# Patient Record
Sex: Female | Born: 1990 | Race: White | Hispanic: No | Marital: Married | Smoking: Never smoker
Health system: Southern US, Community
[De-identification: ages and names within clinical notes are randomized; demographics above are authoritative.]

## PROBLEM LIST (undated history)

## (undated) DIAGNOSIS — Z3046 Encounter for surveillance of implantable subdermal contraceptive: Principal | ICD-10-CM

## (undated) DIAGNOSIS — R058 Other specified cough: Secondary | ICD-10-CM

## (undated) DIAGNOSIS — F419 Anxiety disorder, unspecified: Secondary | ICD-10-CM

## (undated) DIAGNOSIS — M419 Scoliosis, unspecified: Secondary | ICD-10-CM

## (undated) DIAGNOSIS — K219 Gastro-esophageal reflux disease without esophagitis: Secondary | ICD-10-CM

## (undated) DIAGNOSIS — R05 Cough: Secondary | ICD-10-CM

## (undated) HISTORY — DX: Scoliosis, unspecified: M41.9

## (undated) HISTORY — DX: Encounter for surveillance of implantable subdermal contraceptive: Z30.46

## (undated) HISTORY — DX: Other specified cough: R05.8

## (undated) HISTORY — DX: Cough: R05

---
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
## 2010-09-17 ENCOUNTER — Other Ambulatory Visit (HOSPITAL_COMMUNITY)
Admission: RE | Admit: 2010-09-17 | Discharge: 2010-09-17 | Disposition: A | Discharge status: Home / Self Care | Payer: No Typology Code available for payment source | Source: Ambulatory Visit | Attending: Obstetrics and Gynecology | Admitting: Obstetrics and Gynecology

## 2010-09-17 DIAGNOSIS — Z113 Encounter for screening for infections with a predominantly sexual mode of transmission: Secondary | ICD-10-CM | POA: Insufficient documentation

## 2010-09-17 DIAGNOSIS — Z01419 Encounter for gynecological examination (general) (routine) without abnormal findings: Secondary | ICD-10-CM | POA: Insufficient documentation

## 2011-03-08 ENCOUNTER — Emergency Department (INDEPENDENT_AMBULATORY_CARE_PROVIDER_SITE_OTHER)
Admission: EM | Admit: 2011-03-08 | Discharge: 2011-03-08 | Disposition: A | Discharge status: Home / Self Care | Payer: No Typology Code available for payment source | Source: Home / Self Care | Attending: Family Medicine | Admitting: Family Medicine

## 2011-03-08 ENCOUNTER — Other Ambulatory Visit: Payer: Self-pay | Admitting: Family Medicine

## 2011-03-08 ENCOUNTER — Ambulatory Visit (HOSPITAL_COMMUNITY)
Admission: RE | Admit: 2011-03-08 | Discharge: 2011-03-08 | Disposition: A | Discharge status: Home / Self Care | Payer: No Typology Code available for payment source | Source: Ambulatory Visit | Attending: Family Medicine | Admitting: Family Medicine

## 2011-03-08 DIAGNOSIS — S62009A Unspecified fracture of navicular [scaphoid] bone of unspecified wrist, initial encounter for closed fracture: Secondary | ICD-10-CM

## 2011-03-08 DIAGNOSIS — R937 Abnormal findings on diagnostic imaging of other parts of musculoskeletal system: Secondary | ICD-10-CM | POA: Insufficient documentation

## 2011-03-08 DIAGNOSIS — M25539 Pain in unspecified wrist: Secondary | ICD-10-CM

## 2011-03-08 DIAGNOSIS — M25439 Effusion, unspecified wrist: Secondary | ICD-10-CM | POA: Insufficient documentation

## 2011-03-08 MED ORDER — IBUPROFEN 600 MG PO TABS: 600.0000 mg | ORAL_TABLET | Freq: Three times a day (TID) | ORAL | Status: AC

## 2011-03-08 MED ORDER — HYDROCODONE-ACETAMINOPHEN 10-325 MG PO TABS: 1.0000 | ORAL_TABLET | Freq: Once | ORAL | Status: DC

## 2011-03-08 MED ORDER — HYDROCODONE-ACETAMINOPHEN 5-325 MG PO TABS
ORAL_TABLET | ORAL | Status: AC
  Filled 2011-03-08: qty 1

## 2011-03-08 MED ORDER — HYDROCODONE-ACETAMINOPHEN 5-325 MG PO TABS
1.0000 | ORAL_TABLET | Freq: Once | ORAL | Status: AC
  Administered 2011-03-08: 1 via ORAL

## 2011-03-08 NOTE — ED Provider Notes (Signed)
History     CSN: [id number] Arrival date & time: 03/08/2011  7:47 PM   First MD Initiated Contact with Patient 03/08/11 1852      Chief Complaint  Patient presents with  . Wrist Injury    (Consider location/radiation/quality/duration/timing/severity/associated sxs/prior treatment) HPI Comments: 20 y/o right handed female no significant PMH. Was horse riding 2 days ago felt backwards to the floor landing on her right hand. Has had wrist swelling and pain since. Has taken ibuprofen and used a wrist velcro splint but no improvement, was seen earlier today at [facility] today and had X-rays showing hair line right scaphoid fracture and was asked to come here for a cast.  Denies trauma to the back, head or any other body areas.   History reviewed. No pertinent past medical history.  History reviewed. No pertinent past surgical history.  No family history on file.  History  Substance Use Topics  . Smoking status: Never Smoker   . Smokeless tobacco: Not on file  . Alcohol Use: No    OB History    Grav Para Term Preterm Abortions TAB SAB Ect Mult Living                  Review of Systems  Respiratory: Negative for cough and shortness of breath.   Musculoskeletal: Positive for joint swelling.  Neurological: Negative for weakness, numbness and headaches.    Allergies  Review of patient's allergies indicates no known allergies.  Home Medications   Current Outpatient Rx  Name Route Sig Dispense Refill  . IBUPROFEN 600 MG PO TABS Oral Take 1 tablet (600 mg total) by mouth 3 (three) times daily. 30 tablet 0    Take with food    BP 121/52  Pulse 59  Temp(Src) 98.1 F (36.7 C) (Oral)  Resp 16  SpO2 100%  Physical Exam  Nursing note and vitals reviewed. Constitutional: She is oriented to person, place, and time. She appears well-developed and well-nourished. No distress.  HENT:  Head: Normocephalic and atraumatic.  Right Ear: External ear normal.  Left Ear:  External ear normal.  Eyes: EOM are normal. Pupils are equal, round, and reactive to light.  Neck: Normal range of motion. Neck supple.  Cardiovascular: Normal heart sounds.   Pulmonary/Chest: Breath sounds normal.  Abdominal: Soft. There is no tenderness.  Musculoskeletal:       Right wrist: swelling and decreased range of motion. Focal tenderness at base of thumb with resolving bruise. Increased pain with thumb movement. Right hand neurovascularly intact. No skin brakes or lacerations.  Neurological: She is alert and oriented to person, place, and time.    ED Course  Procedures (including critical care time)  Labs Reviewed - No data to display Dg Wrist Complete Right  03/08/2011  *RADIOLOGY REPORT*  Clinical Data: History of injury.  Pain and swelling in the wrist.  RIGHT WRIST - COMPLETE 3+ VIEW  Comparison: None.  Findings: There is a hairline radiolucency through the proximal one third of the scaphoid.  This is consistent with a hairline fracture.  No comminution, dislocation, or other fracture is seen. There is slight soft tissue swelling.  IMPRESSION: Hairline radiolucency through the scaphoid is seen.  This is consistent with a hairline fracture.  Recommend conservative management for fracture.  Slight soft tissue swelling.  Original Report Authenticated By: Crawford Givens, M.D.     1. Scaphoid fracture, wrist, closed       MDM  Dr. Melvyn Novas consulted over the  phone. Pt. Was placed in thumb spica by [employer]. Symptomatic treatment will follow up with hand specialist during the week.        Sharin Grave, MD 03/10/11 1028

## 2011-03-08 NOTE — ED Notes (Signed)
Pt states she fell from her horse on Wednesday, had xray at [facility] today.  They came here for splinting/casting.  Has a hairline fracture rt wrist.  Presents with velcro splint intact.  C/o pain in rt wrist

## 2012-10-19 ENCOUNTER — Encounter: Payer: Self-pay | Admitting: Nurse Practitioner

## 2012-10-19 ENCOUNTER — Ambulatory Visit (INDEPENDENT_AMBULATORY_CARE_PROVIDER_SITE_OTHER): Payer: Federal, State, Local not specified - PPO | Admitting: Nurse Practitioner

## 2012-10-19 VITALS — BP 112/78 | HR 80 | Wt 205.0 lb

## 2012-10-19 DIAGNOSIS — F418 Other specified anxiety disorders: Secondary | ICD-10-CM

## 2012-10-19 DIAGNOSIS — F439 Reaction to severe stress, unspecified: Secondary | ICD-10-CM

## 2012-10-19 DIAGNOSIS — K219 Gastro-esophageal reflux disease without esophagitis: Secondary | ICD-10-CM

## 2012-10-19 DIAGNOSIS — F341 Dysthymic disorder: Secondary | ICD-10-CM

## 2012-10-19 DIAGNOSIS — Z733 Stress, not elsewhere classified: Secondary | ICD-10-CM

## 2012-10-19 MED ORDER — RANITIDINE HCL 300 MG PO TABS: 300.0000 mg | ORAL_TABLET | Freq: Every day | ORAL | Status: DC

## 2012-10-19 MED ORDER — CITALOPRAM HYDROBROMIDE 20 MG PO TABS: ORAL_TABLET | ORAL | Status: DC

## 2012-10-19 NOTE — Patient Instructions (Signed)
Gastroesophageal Reflux Disease, Adult  Gastroesophageal reflux disease (GERD) happens when acid from your stomach flows up into the esophagus. When acid comes in contact with the esophagus, the acid causes soreness (inflammation) in the esophagus. Over time, GERD may create small holes (ulcers) in the lining of the esophagus.  CAUSES   · Increased body weight. This puts pressure on the stomach, making acid rise from the stomach into the esophagus.  · Smoking. This increases acid production in the stomach.  · Drinking alcohol. This causes decreased pressure in the lower esophageal sphincter (valve or ring of muscle between the esophagus and stomach), allowing acid from the stomach into the esophagus.  · Late evening meals and a full stomach. This increases pressure and acid production in the stomach.  · A malformed lower esophageal sphincter.  Sometimes, no cause is found.  SYMPTOMS   · Burning pain in the lower part of the mid-chest behind the breastbone and in the mid-stomach area. This may occur twice a week or more often.  · Trouble swallowing.  · Sore throat.  · Dry cough.  · Asthma-like symptoms including chest tightness, shortness of breath, or wheezing.  DIAGNOSIS   Your caregiver may be able to diagnose GERD based on your symptoms. In some cases, X-rays and other tests may be done to check for complications or to check the condition of your stomach and esophagus.  TREATMENT   Your caregiver may recommend over-the-counter or prescription medicines to help decrease acid production. Ask your caregiver before starting or adding any new medicines.   HOME CARE INSTRUCTIONS   · Change the factors that you can control. Ask your caregiver for guidance concerning weight loss, quitting smoking, and alcohol consumption.  · Avoid foods and drinks that make your symptoms worse, such as:  · Caffeine or alcoholic drinks.  · Chocolate.  · Peppermint or mint flavorings.  · Garlic and onions.  · Spicy foods.  · Citrus fruits,  such as oranges, lemons, or limes.  · Tomato-based foods such as sauce, chili, salsa, and pizza.  · Fried and fatty foods.  · Avoid lying down for the 3 hours prior to your bedtime or prior to taking a nap.  · Eat small, frequent meals instead of large meals.  · Wear loose-fitting clothing. Do not wear anything tight around your waist that causes pressure on your stomach.  · Raise the head of your bed 6 to 8 inches with wood blocks to help you sleep. Extra pillows will not help.  · Only take over-the-counter or prescription medicines for pain, discomfort, or fever as directed by your caregiver.  · Do not take aspirin, ibuprofen, or other nonsteroidal anti-inflammatory drugs (NSAIDs).  SEEK IMMEDIATE MEDICAL CARE IF:   · You have pain in your arms, neck, jaw, teeth, or back.  · Your pain increases or changes in intensity or duration.  · You develop nausea, vomiting, or sweating (diaphoresis).  · You develop shortness of breath, or you faint.  · Your vomit is green, yellow, black, or looks like coffee grounds or blood.  · Your stool is red, bloody, or black.  These symptoms could be signs of other problems, such as heart disease, gastric bleeding, or esophageal bleeding.  MAKE SURE YOU:   · Understand these instructions.  · Will watch your condition.  · Will get help right away if you are not doing well or get worse.  Document Released: 12/19/2004 Document Revised: 06/03/2011 Document Reviewed: 09/28/2010  [employer]® Patient   Information ©2014 [employer].

## 2012-10-20 ENCOUNTER — Encounter: Payer: Self-pay | Admitting: Nurse Practitioner

## 2012-10-20 DIAGNOSIS — F418 Other specified anxiety disorders: Secondary | ICD-10-CM | POA: Insufficient documentation

## 2012-10-20 DIAGNOSIS — K219 Gastro-esophageal reflux disease without esophagitis: Secondary | ICD-10-CM | POA: Insufficient documentation

## 2012-10-20 NOTE — Assessment & Plan Note (Signed)
  Reviewed potential adverse effects of Celexa, DC med and call if any problems. Given information for [facility] and [employer], strongly encouraged mental health counseling. Recheck in 3 weeks.

## 2012-10-20 NOTE — Assessment & Plan Note (Signed)
Cut back on caffeine use. Given written information on GERD. Zantac 300 mg daily when necessary.

## 2012-10-20 NOTE — Progress Notes (Signed)
Subjective:  Presents complaints of decreased energy, and emotional lability, weight gain and sleep disturbance over the past 2 months. Also having headaches and acid reflux. Admits to being under a lot of stress. Having significant issues in her relationship with her mother. Drinks a lot of caffeine. Rare social alcohol. No drug or tobacco use. Denies any suicidal thoughts or ideation.  Objective:   BP 112/78  Pulse 80  Wt 205 lb (92.987 kg) NAD. Alert, oriented. Crying during most of office visit. Lungs clear. Heart regular rate rhythm. Abdomen soft nondistended with minimal epigastric area tenderness.  Assessment:Depression with anxiety  Situational stress  GERD (gastroesophageal reflux disease)  Plan: Meds ordered this encounter  Medications  . citalopram (CELEXA) 20 MG tablet    Sig: 1/2 tab po q hs x 6 days; then if tolerated increase to one tab    Dispense:  30 tablet    Refill:  2    Order Specific Question:  Supervising Provider    Answer:  Merlyn Albert [[id number]]  . ranitidine (ZANTAC) 300 MG tablet    Sig: Take 1 tablet (300 mg total) by mouth at bedtime.    Dispense:  30 tablet    Refill:  2    Order Specific Question:  Supervising Provider    Answer:  Riccardo Dubin   Reviewed potential adverse effects of Celexa, DC med and call if any problems. Given information for [facility] and [employer], strongly encouraged mental health counseling. Given written and verbal information on reflux disease. Recheck in 3 weeks, call back sooner if needed.

## 2012-11-12 ENCOUNTER — Ambulatory Visit: Payer: Federal, State, Local not specified - PPO | Admitting: Nurse Practitioner

## 2012-11-19 ENCOUNTER — Encounter: Payer: Self-pay | Admitting: Nurse Practitioner

## 2012-11-19 ENCOUNTER — Ambulatory Visit (INDEPENDENT_AMBULATORY_CARE_PROVIDER_SITE_OTHER): Payer: Federal, State, Local not specified - PPO | Admitting: Nurse Practitioner

## 2012-11-19 VITALS — BP 132/78 | Ht 67.0 in | Wt 209.0 lb

## 2012-11-19 DIAGNOSIS — J069 Acute upper respiratory infection, unspecified: Secondary | ICD-10-CM

## 2012-11-19 DIAGNOSIS — F341 Dysthymic disorder: Secondary | ICD-10-CM

## 2012-11-19 DIAGNOSIS — K219 Gastro-esophageal reflux disease without esophagitis: Secondary | ICD-10-CM

## 2012-11-19 DIAGNOSIS — F418 Other specified anxiety disorders: Secondary | ICD-10-CM

## 2012-11-19 MED ORDER — SUCRALFATE 1 G PO TABS: 1.0000 g | ORAL_TABLET | Freq: Four times a day (QID) | ORAL | Status: DC

## 2012-11-19 MED ORDER — CITALOPRAM HYDROBROMIDE 20 MG PO TABS: ORAL_TABLET | ORAL | Status: DC

## 2012-11-21 ENCOUNTER — Encounter: Payer: Self-pay | Admitting: Nurse Practitioner

## 2012-11-21 NOTE — Assessment & Plan Note (Signed)
Continue Zantac 300 mg daily. Add Carafate 1 g a.c. and at bedtime when necessary. Call back if persists.

## 2012-11-21 NOTE — Progress Notes (Signed)
Subjective:  Presents complaints of head congestion and scratchy throat for the past several days. No fever. Yellow nasal drainage. No cough or wheezing. No ear pain. No headache. No vomiting diarrhea or abdominal pain. Did very well on her first 2 weeks of Celexa 20 mg, states she felt the best she has in a long time. Effects have not been as good over the past 2 weeks. Denies any adverse affects. Reflux much improved on Zantac 300 mg daily, has had some breakthrough symptoms over the past 4-5 days. Depends on what she eats. Has decreased caffeine in her diet. Also decreased tomato products.  Objective:   BP 132/78  Ht 5\' 7"  (1.702 m)  Wt 209 lb (94.802 kg)  BMI 32.73 kg/m2 NAD. Alert, oriented. TMs clear effusion, no erythema. Pharynx injected with PND noted. Neck supple with mild soft nontender adenopathy. Lungs clear. Heart regular rate rhythm. Abdomen soft nondistended with active bowel sounds; nontender to palpation.  Assessment:Depression with anxiety  GERD (gastroesophageal reflux disease)  Acute upper respiratory infections of unspecified site  Plan: Meds ordered this encounter  Medications  . sucralfate (CARAFATE) 1 G tablet    Sig: Take 1 tablet (1 g total) by mouth 4 (four) times daily.    Dispense:  40 tablet    Refill:  0    Order Specific Question:  Supervising Provider    Answer:  Merlyn Albert [[id number]]  . citalopram (CELEXA) 20 MG tablet    Sig: 1 1/2 tabs po qd    Dispense:  45 tablet    Refill:  2    Order Specific Question:  Supervising Provider    Answer:  Merlyn Albert [[id number]]   increase Celexa 20 mg to one and a half tabs by mouth daily for the next 2 weeks. If needed, increase to 2 tabs per day and call back. Carafate 1 g a.c. and at bedtime when necessary reflux. Continue to cut back on caffeine intake. Continue weight loss efforts. Recheck in 3 months, call back sooner if any problems.

## 2012-11-21 NOTE — Assessment & Plan Note (Signed)
increase Celexa 20 mg to one and a half tabs by mouth daily for the next 2 weeks. If needed, increase to 2 tabs per day and call back

## 2013-01-24 ENCOUNTER — Emergency Department (HOSPITAL_COMMUNITY)
Admission: EM | Admit: 2013-01-24 | Discharge: 2013-01-25 | Disposition: A | Discharge status: Home / Self Care | Payer: Federal, State, Local not specified - PPO | Attending: Emergency Medicine | Admitting: Emergency Medicine

## 2013-01-24 ENCOUNTER — Encounter (HOSPITAL_COMMUNITY): Payer: Self-pay | Admitting: Emergency Medicine

## 2013-01-24 ENCOUNTER — Emergency Department (HOSPITAL_COMMUNITY): Payer: Federal, State, Local not specified - PPO

## 2013-01-24 DIAGNOSIS — M545 Low back pain, unspecified: Secondary | ICD-10-CM | POA: Insufficient documentation

## 2013-01-24 DIAGNOSIS — N390 Urinary tract infection, site not specified: Secondary | ICD-10-CM | POA: Insufficient documentation

## 2013-01-24 DIAGNOSIS — R112 Nausea with vomiting, unspecified: Secondary | ICD-10-CM | POA: Insufficient documentation

## 2013-01-24 DIAGNOSIS — Z791 Long term (current) use of non-steroidal anti-inflammatories (NSAID): Secondary | ICD-10-CM | POA: Insufficient documentation

## 2013-01-24 DIAGNOSIS — R319 Hematuria, unspecified: Secondary | ICD-10-CM | POA: Insufficient documentation

## 2013-01-24 DIAGNOSIS — Z3202 Encounter for pregnancy test, result negative: Secondary | ICD-10-CM | POA: Insufficient documentation

## 2013-01-24 DIAGNOSIS — B9689 Other specified bacterial agents as the cause of diseases classified elsewhere: Secondary | ICD-10-CM

## 2013-01-24 DIAGNOSIS — N760 Acute vaginitis: Secondary | ICD-10-CM | POA: Insufficient documentation

## 2013-01-24 DIAGNOSIS — Z792 Long term (current) use of antibiotics: Secondary | ICD-10-CM | POA: Insufficient documentation

## 2013-01-24 LAB — URINE MICROSCOPIC-ADD ON

## 2013-01-24 LAB — URINALYSIS, ROUTINE W REFLEX MICROSCOPIC
Ketones, ur: NEGATIVE mg/dL
Nitrite: POSITIVE — AB
pH: 7 (ref 5.0–8.0)

## 2013-01-24 MED ORDER — KETOROLAC TROMETHAMINE 30 MG/ML IJ SOLN
30.0000 mg | Freq: Once | INTRAMUSCULAR | Status: AC
  Administered 2013-01-24: 30 mg via INTRAVENOUS
  Filled 2013-01-24: qty 1

## 2013-01-24 MED ORDER — ONDANSETRON HCL 4 MG/2ML IJ SOLN
4.0000 mg | Freq: Once | INTRAMUSCULAR | Status: AC
  Administered 2013-01-24: 4 mg via INTRAVENOUS
  Filled 2013-01-24: qty 2

## 2013-01-24 MED ORDER — HYDROMORPHONE HCL PF 1 MG/ML IJ SOLN
1.0000 mg | Freq: Once | INTRAMUSCULAR | Status: AC
  Administered 2013-01-24: 1 mg via INTRAVENOUS
  Filled 2013-01-24: qty 1

## 2013-01-24 NOTE — ED Notes (Signed)
Lower back pain, I had a UTI towards the end of the week. Started taking AZO and it helped a little and it got worse per pt. Frequent urination per pt.

## 2013-01-24 NOTE — ED Provider Notes (Signed)
CSN: [id number]     Arrival date & time 01/24/13  2012 History   First MD Initiated Contact with Patient 01/24/13 2035     Chief Complaint  Patient presents with  . Urinary Frequency  . Back Pain   (Consider location/radiation/quality/duration/timing/severity/associated sxs/prior Treatment) HPI Comments: Allison Mckinney is a 22 y.o. Female presenting with dysuria including increased urinary frequency,  Painful urination and hematuria starting 3 days ago.  She increased her fluid intake and started taking azo which improved her urinary frequency but today she developed worsening symptoms again including one episode of emesis and now has severe right lower back pain which is constant and sharp and is not worsened with movement.  She denies history of kidney stones but has a  Family history.  She has had no fevers or chills and no abdominal upper abdominal or flank pain. She borrowed 2 cipro tablets today with no improvement in symptoms.     The history is provided by the patient, a parent and a friend.    Past Medical History  Diagnosis Date  . Scoliosis   . Recurrent cough    History reviewed. No pertinent past surgical history. Family History  Problem Relation Age of Onset  . Anemia Mother    History  Substance Use Topics  . Smoking status: Never Smoker   . Smokeless tobacco: Never Used  . Alcohol Use: Yes     Comment: rare social   OB History   Grav Para Term Preterm Abortions TAB SAB Ect Mult Living                 Review of Systems  Constitutional: Negative for fever and chills.  HENT: Negative for congestion and sore throat.   Eyes: Negative.   Respiratory: Negative for chest tightness and shortness of breath.   Cardiovascular: Negative for chest pain.  Gastrointestinal: Positive for nausea and vomiting. Negative for abdominal pain.  Genitourinary: Positive for dysuria, urgency, frequency and hematuria. Negative for flank pain, vaginal discharge and vaginal pain.   Musculoskeletal: Positive for back pain. Negative for arthralgias, joint swelling and neck pain.  Skin: Negative.  Negative for rash and wound.  Neurological: Negative for dizziness, weakness, light-headedness, numbness and headaches.  Psychiatric/Behavioral: Negative.     Allergies  Other  Home Medications   Current Outpatient Rx  Name  Route  Sig  Dispense  Refill  . phenazopyridine (AZO-STANDARD) 95 MG tablet   Oral   Take 190 mg by mouth 3 (three) times daily as needed for pain.         . ciprofloxacin (CIPRO) 500 MG tablet   Oral   Take 1 tablet (500 mg total) by mouth 2 (two) times daily.   14 tablet   0   . metroNIDAZOLE (FLAGYL) 500 MG tablet   Oral   Take 1 tablet (500 mg total) by mouth 2 (two) times daily.   14 tablet   0   . naproxen (NAPROSYN) 500 MG tablet   Oral   Take 1 tablet (500 mg total) by mouth 2 (two) times daily.   30 tablet   0   . oxyCODONE-acetaminophen (PERCOCET/ROXICET) 5-325 MG per tablet   Oral   Take 1 tablet by mouth every 4 (four) hours as needed for pain.   10 tablet   0    BP 123/59  Pulse 70  Temp(Src) 97.7 F (36.5 C) (Oral)  Resp 20  Ht 5\' 4"  (1.626 m)  Wt 205 lb (  92.987 kg)  BMI 35.17 kg/m2  SpO2 97% Physical Exam  Nursing note and vitals reviewed. Constitutional: She appears well-developed and well-nourished.  HENT:  Head: Normocephalic and atraumatic.  Eyes: Conjunctivae are normal.  Neck: Normal range of motion.  Cardiovascular: Normal rate, regular rhythm, normal heart sounds and intact distal pulses.   Pulmonary/Chest: Effort normal and breath sounds normal. She has no wheezes.  Abdominal: Soft. Bowel sounds are normal. There is no tenderness.  Genitourinary: Vagina normal and uterus normal. Cervix exhibits no motion tenderness. Right adnexum displays no mass and no tenderness. Left adnexum displays no mass and no tenderness. No vaginal discharge found.  Musculoskeletal: Normal range of motion.   Neurological: She is alert.  Skin: Skin is warm and dry.  Psychiatric: She has a normal mood and affect.    ED Course  Procedures (including critical care time) Labs Review Labs Reviewed  WET PREP, GENITAL - Abnormal; Notable for the following:    Clue Cells Wet Prep HPF POC MODERATE (*)    WBC, Wet Prep HPF POC FEW (*)    All other components within normal limits  URINALYSIS, ROUTINE W REFLEX MICROSCOPIC - Abnormal; Notable for the following:    Color, Urine ORANGE (*)    Glucose, UA 250 (*)    Hgb urine dipstick TRACE (*)    Bilirubin Urine SMALL (*)    Protein, ur 100 (*)    Urobilinogen, UA >8.0 (*)    Nitrite POSITIVE (*)    Leukocytes, UA TRACE (*)    All other components within normal limits  URINE MICROSCOPIC-ADD ON - Abnormal; Notable for the following:    Squamous Epithelial / LPF FEW (*)    Bacteria, UA FEW (*)    All other components within normal limits  GC/CHLAMYDIA PROBE AMP  PREGNANCY, URINE   Imaging Review Ct Abdomen Pelvis Wo Contrast  01/24/2013   CLINICAL DATA:  Left-sided flank pain. Currently being treated for urinary tract infection.  EXAM: CT ABDOMEN AND PELVIS WITHOUT CONTRAST  TECHNIQUE: Multidetector CT imaging of the abdomen and pelvis was performed following the standard protocol without intravenous contrast.  COMPARISON:  No priors.  FINDINGS: Lung Bases: Unremarkable.  Abdomen/Pelvis: There are no abnormal calcifications within the collecting system of either kidney, along the course of either ureter, or within the lumen of the urinary bladder. No hydroureteronephrosis or perinephric stranding to suggest urinary tract obstruction at this time. The unenhanced appearance of the kidneys is unremarkable bilaterally.  The unenhanced appearance of the liver, gallbladder, pancreas, spleen and bilateral adrenal glands is unremarkable. No significant volume of ascites. No pneumoperitoneum. No pathologic distention of small bowel. Appendicolith present in the  appendix, but no surrounding inflammatory changes to suggest an acute appendicitis at this time. Appendix is normal in caliber. Uterus and ovaries are unremarkable in appearance on today's non contrast CT examination. Urinary bladder is normal in appearance.  Musculoskeletal: There are no aggressive appearing lytic or blastic lesions noted in the visualized portions of the skeleton.  IMPRESSION: 1. No acute findings in the abdomen or pelvis to account for the patient's symptoms. Specifically, no abnormal urinary tract calculi or findings of urinary tract obstruction. 2. Additional incidental findings, as above.   Electronically Signed   By: Trudie Reed M.D.   On: 01/24/2013 22:31    EKG Interpretation   None       MDM   1. UTI (lower urinary tract infection)   2. Bacterial vaginosis    Patient prescribed  cipro, flagyl.  Hydrocodone, naproxen. Encouraged increased fluids,  May continue with azo.  F/u with pcp prn no improvement or for worsened sx.    Burgess Amor, PA-C 01/25/13 0041

## 2013-01-25 LAB — WET PREP, GENITAL

## 2013-01-25 MED ORDER — CIPROFLOXACIN HCL 500 MG PO TABS: 500.0000 mg | ORAL_TABLET | Freq: Two times a day (BID) | ORAL | Status: DC

## 2013-01-25 MED ORDER — OXYCODONE-ACETAMINOPHEN 5-325 MG PO TABS: 1.0000 | ORAL_TABLET | ORAL | Status: DC | PRN

## 2013-01-25 MED ORDER — METRONIDAZOLE 500 MG PO TABS: 500.0000 mg | ORAL_TABLET | Freq: Two times a day (BID) | ORAL | Status: DC

## 2013-01-25 MED ORDER — NAPROXEN 500 MG PO TABS: 500.0000 mg | ORAL_TABLET | Freq: Two times a day (BID) | ORAL | Status: DC

## 2013-01-25 NOTE — ED Provider Notes (Signed)
Medical screening examination/treatment/procedure(s) were performed by non-physician practitioner and as supervising physician I was immediately available for consultation/collaboration.  EKG Interpretation   None         Laray Anger, DO 01/25/13 1015

## 2013-01-26 LAB — GC/CHLAMYDIA PROBE AMP
CT Probe RNA: NEGATIVE
GC Probe RNA: NEGATIVE

## 2013-04-01 ENCOUNTER — Other Ambulatory Visit: Payer: Self-pay | Admitting: Nurse Practitioner

## 2013-08-05 ENCOUNTER — Encounter: Payer: Self-pay | Admitting: Nurse Practitioner

## 2013-08-05 ENCOUNTER — Ambulatory Visit (INDEPENDENT_AMBULATORY_CARE_PROVIDER_SITE_OTHER): Payer: Federal, State, Local not specified - PPO | Admitting: Nurse Practitioner

## 2013-08-05 VITALS — BP 112/82 | Temp 98.2°F | Ht 67.0 in | Wt 210.0 lb

## 2013-08-05 DIAGNOSIS — R82998 Other abnormal findings in urine: Secondary | ICD-10-CM

## 2013-08-05 DIAGNOSIS — M545 Low back pain, unspecified: Secondary | ICD-10-CM

## 2013-08-05 DIAGNOSIS — R8290 Unspecified abnormal findings in urine: Secondary | ICD-10-CM

## 2013-08-05 DIAGNOSIS — F418 Other specified anxiety disorders: Secondary | ICD-10-CM

## 2013-08-05 DIAGNOSIS — F341 Dysthymic disorder: Secondary | ICD-10-CM

## 2013-08-05 LAB — POCT URINALYSIS DIPSTICK
PH UA: 7
Spec Grav, UA: 1.01

## 2013-08-05 MED ORDER — SULFAMETHOXAZOLE-TMP DS 800-160 MG PO TABS: 1.0000 | ORAL_TABLET | Freq: Two times a day (BID) | ORAL | Status: DC

## 2013-08-07 LAB — URINE CULTURE
COLONY COUNT: NO GROWTH
Organism ID, Bacteria: NO GROWTH

## 2013-08-09 ENCOUNTER — Encounter: Payer: Self-pay | Admitting: Nurse Practitioner

## 2013-08-09 LAB — POCT UA - MICROSCOPIC ONLY
Bacteria, U Microscopic: NEGATIVE
RBC, URINE, MICROSCOPIC: NEGATIVE

## 2013-08-09 NOTE — Progress Notes (Signed)
Subjective:  Presents complaints of slight urinary frequency. No dysuria urgency fever or flank pain. Had some low back pain last week which has resolved, none for the past 4 days. No history of injury. Has Nexplanon, no menstrual cycle. Vaginal discharge. No pelvic pain. Living with her father, doing well. Has been very upset this week, she and her boyfriend of 8 years broke up a week ago. Since then decreased appetite fatigue. Sleeping well. Denies any suicidal or homicidal thoughts or ideation. Has an excellent support system.  Objective:   BP 112/82  Temp(Src) 98.2 F (36.8 C)  Ht 5\' 7"  (1.702 m)  Wt 210 lb (95.255 kg)  BMI 32.88 kg/m2 NAD. Alert, oriented. Lungs clear. Heart regular rate rhythm. No CVA or flank tenderness. Results for orders placed in visit on 08/05/13                        Result Value Ref Range   Color, UA       Clarity, UA       Glucose, UA       Bilirubin, UA       Ketones, UA       Spec Grav, UA 1.010     Blood, UA       pH, UA 7.0     Protein, UA       Urobilinogen, UA       Nitrite, UA       Leukocytes, UA moderate (2+)    Urine micro-rare WBC. Otherwise negative.   Assessment:  Problem List Items Addressed This Visit     Other   Depression with anxiety - Primary    Other Visit Diagnoses   Low back pain        Relevant Orders       POCT urinalysis dipstick (Completed)       Urine culture (Completed)       POCT UA - Microscopic Only (Completed)    Abnormal finding on urinalysis        Relevant Orders       POCT UA - Microscopic Only (Completed)       Plan:  Meds ordered this encounter  Medications  . sulfamethoxazole-trimethoprim (BACTRIM DS) 800-160 MG per tablet    Sig: Take 1 tablet by mouth 2 (two) times daily.    Dispense:  14 tablet    Refill:  0    Order Specific Question:  Supervising Provider    Answer:  Merlyn AlbertLUKING, WILLIAM S [[id number]]   Patient defers medication at this time. Discussed importance of stress reduction and use  of her support system. Recheck if symptoms worsen or persist.

## 2013-12-06 ENCOUNTER — Emergency Department (HOSPITAL_COMMUNITY)
Admission: EM | Admit: 2013-12-06 | Discharge: 2013-12-06 | Disposition: A | Discharge status: Home / Self Care | Payer: Federal, State, Local not specified - PPO | Attending: Emergency Medicine | Admitting: Emergency Medicine

## 2013-12-06 ENCOUNTER — Encounter (HOSPITAL_COMMUNITY): Payer: Self-pay | Admitting: Emergency Medicine

## 2013-12-06 DIAGNOSIS — R112 Nausea with vomiting, unspecified: Secondary | ICD-10-CM | POA: Diagnosis present

## 2013-12-06 DIAGNOSIS — B349 Viral infection, unspecified: Secondary | ICD-10-CM

## 2013-12-06 DIAGNOSIS — Z3202 Encounter for pregnancy test, result negative: Secondary | ICD-10-CM | POA: Diagnosis not present

## 2013-12-06 DIAGNOSIS — Z8739 Personal history of other diseases of the musculoskeletal system and connective tissue: Secondary | ICD-10-CM | POA: Insufficient documentation

## 2013-12-06 DIAGNOSIS — Z8719 Personal history of other diseases of the digestive system: Secondary | ICD-10-CM | POA: Diagnosis not present

## 2013-12-06 DIAGNOSIS — B9789 Other viral agents as the cause of diseases classified elsewhere: Secondary | ICD-10-CM | POA: Insufficient documentation

## 2013-12-06 DIAGNOSIS — Z8659 Personal history of other mental and behavioral disorders: Secondary | ICD-10-CM | POA: Diagnosis not present

## 2013-12-06 DIAGNOSIS — R6883 Chills (without fever): Secondary | ICD-10-CM | POA: Diagnosis not present

## 2013-12-06 HISTORY — DX: Anxiety disorder, unspecified: F41.9

## 2013-12-06 HISTORY — DX: Gastro-esophageal reflux disease without esophagitis: K21.9

## 2013-12-06 LAB — URINALYSIS, ROUTINE W REFLEX MICROSCOPIC
GLUCOSE, UA: NEGATIVE mg/dL
Nitrite: NEGATIVE
Specific Gravity, Urine: 1.02 (ref 1.005–1.030)
UROBILINOGEN UA: 2 mg/dL — AB (ref 0.0–1.0)
pH: 5.5 (ref 5.0–8.0)

## 2013-12-06 LAB — URINE MICROSCOPIC-ADD ON

## 2013-12-06 LAB — RAPID STREP SCREEN (MED CTR MEBANE ONLY): Streptococcus, Group A Screen (Direct): NEGATIVE

## 2013-12-06 LAB — POC URINE PREG, ED: Preg Test, Ur: NEGATIVE

## 2013-12-06 MED ORDER — ONDANSETRON HCL 4 MG/2ML IJ SOLN
4.0000 mg | Freq: Once | INTRAMUSCULAR | Status: DC
  Filled 2013-12-06: qty 2

## 2013-12-06 MED ORDER — SODIUM CHLORIDE 0.9 % IV SOLN
1000.0000 mL | Freq: Once | INTRAVENOUS | Status: AC
  Administered 2013-12-06: 1000 mL via INTRAVENOUS

## 2013-12-06 MED ORDER — ACETAMINOPHEN 500 MG PO TABS
1000.0000 mg | ORAL_TABLET | Freq: Once | ORAL | Status: AC
  Administered 2013-12-06: 1000 mg via ORAL
  Filled 2013-12-06: qty 2

## 2013-12-06 MED ORDER — ONDANSETRON 4 MG PO TBDP: 4.0000 mg | ORAL_TABLET | Freq: Three times a day (TID) | ORAL | Status: DC | PRN

## 2013-12-06 MED ORDER — SODIUM CHLORIDE 0.9 % IV SOLN
1000.0000 mL | INTRAVENOUS | Status: DC
  Administered 2013-12-06: 1000 mL via INTRAVENOUS

## 2013-12-06 MED ORDER — KETOROLAC TROMETHAMINE 30 MG/ML IJ SOLN
30.0000 mg | Freq: Once | INTRAMUSCULAR | Status: AC
  Administered 2013-12-06: 30 mg via INTRAVENOUS
  Filled 2013-12-06: qty 1

## 2013-12-06 MED ORDER — IBUPROFEN 800 MG PO TABS: 800.0000 mg | ORAL_TABLET | Freq: Three times a day (TID) | ORAL | Status: DC

## 2013-12-06 MED ORDER — ONDANSETRON HCL 4 MG/2ML IJ SOLN
4.0000 mg | Freq: Once | INTRAMUSCULAR | Status: AC
  Administered 2013-12-06: 4 mg via INTRAVENOUS

## 2013-12-06 NOTE — ED Provider Notes (Signed)
CSN: [id number]     Arrival date & time 12/06/13  4098 History   First MD Initiated Contact with Patient 12/06/13 0957     Chief Complaint  Patient presents with  . Emesis  . Chills     (Consider location/radiation/quality/duration/timing/severity/associated sxs/prior Treatment) HPI Comments: Patient is a 23 year old female who presents to the emergency department with chills and vomiting.  The patient states that over the past 4 days she's been having increasing problems with chills, and over the past couple of days has been having some vomiting as well. She complains of body aches and generally not feeling well. She has been unable to keep foods down, she went to a clinic connected to her job and they told her that she was probably dehydrated and that she should be seen at an urgent care or a hospital that could provide hydration for her. The patient states she is not aware of any high fevers. She's not had any unusual rashes. She works at an [employer], but has not recalled pulling off any ticks recently. She has had a cat bite to the finger, but has been treated with antibiotics for this and it is resolving. She presents now for assistance with her chills and vomiting, and for evaluation of her hydration status.  The history is provided by the patient.    Past Medical History  Diagnosis Date  . Scoliosis   . Recurrent cough   . Acid reflux   . Anxiety    History reviewed. No pertinent past surgical history. Family History  Problem Relation Age of Onset  . Anemia Mother    History  Substance Use Topics  . Smoking status: Never Smoker   . Smokeless tobacco: Never Used  . Alcohol Use: Yes     Comment: rare social   OB History   Grav Para Term Preterm Abortions TAB SAB Ect Mult Living                 Review of Systems  Constitutional: Positive for chills. Negative for fever and activity change.       All ROS Neg except as noted in HPI  Eyes: Negative for photophobia  and discharge.  Respiratory: Negative for cough, shortness of breath and wheezing.   Cardiovascular: Negative for chest pain and palpitations.  Gastrointestinal: Positive for nausea and vomiting. Negative for abdominal pain, diarrhea, constipation and blood in stool.  Genitourinary: Negative for dysuria, frequency and hematuria.  Musculoskeletal: Negative for arthralgias, back pain and neck pain.  Skin: Negative.   Neurological: Negative for dizziness, seizures and speech difficulty.  Psychiatric/Behavioral: Negative for hallucinations and confusion. The patient is nervous/anxious.       Allergies  Other  Home Medications   Prior to Admission medications   Medication Sig Start Date End Date Taking? Authorizing Provider  ibuprofen (ADVIL,MOTRIN) 200 MG tablet Take 400 mg by mouth every 6 (six) hours as needed for moderate pain.   Yes Historical Provider, MD  ibuprofen (ADVIL,MOTRIN) 800 MG tablet Take 1 tablet (800 mg total) by mouth 3 (three) times daily. 12/06/13   Kathie Dike, PA-C  ondansetron (ZOFRAN ODT) 4 MG disintegrating tablet Take 1 tablet (4 mg total) by mouth every 8 (eight) hours as needed for nausea or vomiting. 12/06/13   Kathie Dike, PA-C   BP 107/66  Pulse 56  Temp(Src) 97.7 F (36.5 C) (Oral)  Resp 16  Ht  (1.702 m)  Wt 198 lb (89.812 kg)  BMI  31.00 kg/m2  SpO2 100% Physical Exam  Nursing note and vitals reviewed. Constitutional: She is oriented to person, place, and time. She appears well-developed and well-nourished.  Non-toxic appearance. No distress.  HENT:  Head: Normocephalic.  Right Ear: Tympanic membrane and external ear normal.  Left Ear: Tympanic membrane and external ear normal.  Eyes: EOM and lids are normal. Pupils are equal, round, and reactive to light. No scleral icterus.  Neck: Normal range of motion. Neck supple. Carotid bruit is not present.  Cardiovascular: Normal rate, regular rhythm, normal heart sounds, intact distal pulses  and normal pulses.  Exam reveals no gallop and no friction rub.   No murmur heard. Pulmonary/Chest: Breath sounds normal. No respiratory distress. She has no wheezes.  Abdominal: Soft. Bowel sounds are normal. She exhibits no mass. There is no tenderness. There is no rebound and no guarding.  Musculoskeletal: Normal range of motion. She exhibits no edema and no tenderness.  There is a healing puncture wound to the distal index finger on the right hand. There is no drainage. There is no red streaking appreciated. Patient has good range of motion of the finger. No evidence for tenosynovitis.  Lymphadenopathy:       Head (right side): No submandibular adenopathy present.       Head (left side): No submandibular adenopathy present.    She has no cervical adenopathy.  Neurological: She is alert and oriented to person, place, and time. She has normal strength. No cranial nerve deficit or sensory deficit. She exhibits normal muscle tone. Coordination normal.  Skin: Skin is warm and dry. No rash noted. She is not diaphoretic.  Psychiatric: She has a normal mood and affect. Her speech is normal.    ED Course  Procedures (including critical care time) Labs Review Labs Reviewed  URINALYSIS, ROUTINE W REFLEX MICROSCOPIC - Abnormal; Notable for the following:    Color, Urine BROWN (*)    APPearance CLOUDY (*)    Hgb urine dipstick SMALL (*)    Bilirubin Urine MODERATE (*)    Ketones, ur TRACE (*)    Protein, ur TRACE (*)    Urobilinogen, UA 2.0 (*)    Leukocytes, UA TRACE (*)    All other components within normal limits  URINE MICROSCOPIC-ADD ON - Abnormal; Notable for the following:    Squamous Epithelial / LPF FEW (*)    Bacteria, UA MANY (*)    All other components within normal limits  RAPID STREP SCREEN  URINE CULTURE  CULTURE, GROUP A STREP  POC URINE PREG, ED    Imaging Review No results found.   EKG Interpretation None      MDM  Vital signs are within normal limits. No  rash, no history of fever, or tick bite.  Patient has been treated with antibiotics for the cat bite to the finger, there is no evidence of cellulitis or infection or tenosynovitis.  Patient responds well to IV fluids, Tylenol, and IV Toradol. Stay she feels much better after the fluids and medications. Suspect the patient has a viral illness. I have asked the patient to wash hands frequently. Increase fluids. Prescription given for ibuprofen 800 mg and Zofran ODT. Patient is to return to the emergency apartment immediately if any changes, problems, or concerns.    Final diagnoses:  Viral illness    *I have reviewed nursing notes, vital signs, and all appropriate lab and imaging results for this patient.Kathie Dike, PA-C 12/06/13 1756

## 2013-12-06 NOTE — ED Notes (Signed)
Pt stated she was dizzy and lightheaded when she stood to ambulate.  Pt returned to bed and is receiving second bag of fluids.

## 2013-12-06 NOTE — ED Notes (Signed)
Patient complaining of vomiting and chills x 4 days. States she was seen at a clinic and they told her she was dehydrated.

## 2013-12-06 NOTE — ED Notes (Signed)
Pt ambulated around nursing station with steady gait.  Pt denies dizziness at this time.

## 2013-12-06 NOTE — ED Notes (Addendum)
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 She has received medical treatment and site is improved.  Site is red with a yellow center.

## 2013-12-06 NOTE — Discharge Instructions (Signed)
Please increase fluids. Please use Zofran every 6 hours for nausea. Please use ibuprofen 3 times daily or every 6 hours for body aches, chills, or fever. Please wash hands frequently. Please return to the emergency department or see Dr.lUKING for additional evaluation if not improving. Viral Infections A virus is a type of germ. Viruses can cause:  Minor sore throats.  Aches and pains.  Headaches.  Runny nose.  Rashes.  Watery eyes.  Tiredness.  Coughs.  Loss of appetite.  Feeling sick to your stomach (nausea).  Throwing up (vomiting).  Watery poop (diarrhea). HOME CARE   Only take medicines as told by your doctor.  Drink enough water and fluids to keep your pee (urine) clear or pale yellow. Sports drinks are a good choice.  Get plenty of rest and eat healthy. Soups and broths with crackers or rice are fine. GET HELP RIGHT AWAY IF:   You have a very bad headache.  You have shortness of breath.  You have chest pain or neck pain.  You have an unusual rash.  You cannot stop throwing up.  You have watery poop that does not stop.  You cannot keep fluids down.  You or your child has a temperature by mouth above 102 F (38.9 C), not controlled by medicine.  Your baby is older than 3 months with a rectal temperature of 102 F (38.9 C) or higher.  Your baby is 36 months old or younger with a rectal temperature of 100.4 F (38 C) or higher. MAKE SURE YOU:   Understand these instructions.  Will watch this condition.  Will get help right away if you are not doing well or get worse. Document Released: 02/22/2008 Document Revised: 06/03/2011 Document Reviewed: 07/17/2010 [facility] Patient Information 2015 [place], [place]. This information is not intended to replace advice given to you by your health care provider. Make sure you discuss any questions you have with your health care provider.

## 2013-12-07 ENCOUNTER — Ambulatory Visit (INDEPENDENT_AMBULATORY_CARE_PROVIDER_SITE_OTHER): Payer: Federal, State, Local not specified - PPO | Admitting: Family Medicine

## 2013-12-07 ENCOUNTER — Encounter: Payer: Self-pay | Admitting: Family Medicine

## 2013-12-07 VITALS — BP 114/70 | Temp 99.0°F | Ht 67.0 in | Wt 198.1 lb

## 2013-12-07 DIAGNOSIS — A088 Other specified intestinal infections: Secondary | ICD-10-CM

## 2013-12-07 DIAGNOSIS — A084 Viral intestinal infection, unspecified: Secondary | ICD-10-CM

## 2013-12-07 LAB — URINE CULTURE: Colony Count: 25000

## 2013-12-07 MED ORDER — PROMETHAZINE HCL 25 MG PO TABS: 25.0000 mg | ORAL_TABLET | Freq: Four times a day (QID) | ORAL | Status: DC | PRN

## 2013-12-07 NOTE — Progress Notes (Signed)
   Subjective:    Patient ID: Allison Mckinney, female    DOB: Nov 29, 1990, 23 y.o.   MRN: [id number]  HPI Patient is here for a ER follow up visit. Patient was seen at [facility] yesterday for vomiting and fatigue. Patient was prescribed Zofran for her symptoms. Patient states that she still feels fatigued and has no appetite. She has not vomited since ER visit. Patient states that she is coughing also.   Some stom [ain persist  vom times one last night  Still wiped out  Appetite fair but not great  Drinking more fluids than po foods   No gi exposure   Patient states that she has no other concerns at this time.    Review of Systems Chest pain no fever no chills no rash ROS otherwise negative    Objective:   Physical Exam Alert mild malaise. HEENT normal. Lungs clear. Heart regular in rhythm. Abdomen hyperactive bowel sounds no discrete tenderness       Assessment & Plan:  Impression gastroenteritis suboptimal response to Zofran plan switch to Phenergan. Symptomatic care discussed. Warning signs discussed. WSL

## 2013-12-07 NOTE — ED Provider Notes (Signed)
History/physical exam/procedure(s) were performed by non-physician practitioner and as supervising physician I was immediately available for consultation/collaboration. I have reviewed all notes and am in agreement with care and plan.   Hilario Quarry, MD 12/07/13 [phone]

## 2013-12-08 LAB — CULTURE, GROUP A STREP

## 2014-02-04 ENCOUNTER — Ambulatory Visit (INDEPENDENT_AMBULATORY_CARE_PROVIDER_SITE_OTHER): Payer: Federal, State, Local not specified - PPO | Admitting: Nurse Practitioner

## 2014-02-04 ENCOUNTER — Encounter: Payer: Self-pay | Admitting: Nurse Practitioner

## 2014-02-04 VITALS — BP 132/74 | Temp 98.7°F | Ht 64.75 in | Wt 189.0 lb

## 2014-02-04 DIAGNOSIS — Z113 Encounter for screening for infections with a predominantly sexual mode of transmission: Secondary | ICD-10-CM

## 2014-02-04 DIAGNOSIS — R300 Dysuria: Secondary | ICD-10-CM

## 2014-02-04 MED ORDER — NITROFURANTOIN MONOHYD MACRO 100 MG PO CAPS: 100.0000 mg | ORAL_CAPSULE | Freq: Two times a day (BID) | ORAL | Status: DC

## 2014-02-05 LAB — GC/CHLAMYDIA PROBE AMP, URINE
CHLAMYDIA, SWAB/URINE, PCR: NEGATIVE
GC Probe Amp, Urine: NEGATIVE

## 2014-02-08 ENCOUNTER — Encounter: Payer: Self-pay | Admitting: Nurse Practitioner

## 2014-02-08 LAB — POCT UA - MICROSCOPIC ONLY: Bacteria, U Microscopic: POSITIVE

## 2014-02-08 NOTE — Progress Notes (Signed)
Subjective:  Presents for c/o dysuria x 3 d. Frequency and urgency. No flank or back pain. No recent UTI. No fever. No N/V. No vaginal discharge. New sexual partner x 4 months. Has nexplanon for birth control; having slight bleeding. Minimal relief with AZO.   Objective:   BP 132/74 mmHg  Temp(Src) 98.7 F (37.1 C)  Ht 5' 4.75" (1.645 m)  Wt 189 lb (85.73 kg)  BMI 31.68 kg/m2 NAD. Alert, oriented. Lungs clear. No CVA or flank tenderness. Heart RRR. Abdomen soft, non distended with minimal suprapubic area tenderness.  Results for orders placed or performed in visit on 02/04/14  GC/chlamydia probe amp, urine  Result Value Ref Range   Chlamydia, Swab/Urine, PCR NEGATIVE NEGATIVE   GC Probe Amp, Urine NEGATIVE NEGATIVE  POCT UA - Microscopic Only  Result Value Ref Range   WBC, Ur, HPF, POC rare    RBC, urine, microscopic occas    Bacteria, U Microscopic pos    Mucus, UA     Epithelial cells, urine per micros multiple    Crystals, Ur, HPF, POC     Casts, Ur, LPF, POC     Yeast, UA       Assessment: Dysuria  Screen for STD (sexually transmitted disease) - Plan: GC/chlamydia probe amp, urine  Meds ordered this encounter  Medications  . nitrofurantoin, macrocrystal-monohydrate, (MACROBID) 100 MG capsule    Sig: Take 1 capsule (100 mg total) by mouth 2 (two) times daily.    Dispense:  14 capsule    Refill:  0    Order Specific Question:  Supervising Provider    Answer:  Merlyn AlbertLUKING, WILLIAM S [[id number]]   Warning signs reviewed. Call back in 72 hours if no improvement, seek help this weekend if worse.

## 2014-02-22 ENCOUNTER — Encounter: Payer: Federal, State, Local not specified - PPO | Admitting: Nurse Practitioner

## 2014-03-02 ENCOUNTER — Encounter: Payer: Federal, State, Local not specified - PPO | Admitting: Nurse Practitioner

## 2014-03-14 ENCOUNTER — Encounter: Payer: Federal, State, Local not specified - PPO | Admitting: Nurse Practitioner

## 2014-07-08 ENCOUNTER — Encounter: Payer: Federal, State, Local not specified - PPO | Admitting: Obstetrics and Gynecology

## 2014-09-07 ENCOUNTER — Ambulatory Visit: Payer: Federal, State, Local not specified - PPO | Admitting: Family Medicine

## 2014-09-07 DIAGNOSIS — Z029 Encounter for administrative examinations, unspecified: Secondary | ICD-10-CM

## 2014-09-28 ENCOUNTER — Ambulatory Visit (INDEPENDENT_AMBULATORY_CARE_PROVIDER_SITE_OTHER): Payer: Federal, State, Local not specified - PPO | Admitting: Adult Health

## 2014-09-28 ENCOUNTER — Encounter: Payer: Self-pay | Admitting: Adult Health

## 2014-09-28 VITALS — BP 110/60 | HR 72 | Wt 206.0 lb

## 2014-09-28 DIAGNOSIS — Z3049 Encounter for surveillance of other contraceptives: Secondary | ICD-10-CM | POA: Diagnosis not present

## 2014-09-28 DIAGNOSIS — Z3046 Encounter for surveillance of implantable subdermal contraceptive: Secondary | ICD-10-CM | POA: Insufficient documentation

## 2014-09-28 HISTORY — DX: Encounter for surveillance of implantable subdermal contraceptive: Z30.46

## 2014-09-28 NOTE — Progress Notes (Signed)
Subjective:     Patient ID: Allison Mckinney, female   DOB: 12-30-1990, 24 y.o.   MRN: [id number]  HPI Allison Mckinney is a 24 year old white female in for implanon removal, says she will use condoms.No complaints.  Review of Systems Patient denies any headaches, hearing loss, fatigue, blurred vision, shortness of breath, chest pain, abdominal pain, problems with bowel movements, urination, or intercourse. No joint pain or mood swings. Reviewed past medical,surgical, social and family history. Reviewed medications and allergies.     Objective:   Physical Exam BP 110/60 mmHg  Pulse 72  Wt 206 lb (93.441 kg)  LMP 09/23/2014 Consent signed, time out called,   left arm cleansed with betadine, and injected with 2.5 cc 1% lidocaine and waited til numb.Under sterile technique a #11 blade was used to make small vertical incision, and a curved forceps was used to easily remove rod. Steri strips applied. Pressure dressing applied.  Assessment:     Implanon removal     Plan:     Use condoms, keep clean and dry x 24 hours, no heavy lifting for 24 hours, keep steri strips on x 72 hours, Keep pressure dressing on x 24 hours. Follow up prn problems.   Return in 3 months for pap and physical

## 2014-09-28 NOTE — Patient Instructions (Signed)
Use condoms, keep clean and dry x 24 hours, no heavy lifting,for 24 hours, keep steri strips on x 72 hours, Keep pressure dressing on x 24 hours. Follow up prn problems. Return in 3 months for pap and physical

## 2014-10-11 ENCOUNTER — Encounter: Payer: Self-pay | Admitting: Family Medicine

## 2014-10-11 ENCOUNTER — Ambulatory Visit (INDEPENDENT_AMBULATORY_CARE_PROVIDER_SITE_OTHER): Payer: Federal, State, Local not specified - PPO | Admitting: Family Medicine

## 2014-10-11 VITALS — BP 110/74 | Ht 64.0 in | Wt 205.0 lb

## 2014-10-11 DIAGNOSIS — M549 Dorsalgia, unspecified: Secondary | ICD-10-CM

## 2014-10-11 MED ORDER — HYDROCODONE-ACETAMINOPHEN 5-325 MG PO TABS: 1.0000 | ORAL_TABLET | ORAL | Status: DC | PRN

## 2014-10-11 MED ORDER — MELOXICAM 15 MG PO TABS: 15.0000 mg | ORAL_TABLET | Freq: Every day | ORAL | Status: DC

## 2014-10-11 MED ORDER — CYCLOBENZAPRINE HCL 10 MG PO TABS: 10.0000 mg | ORAL_TABLET | Freq: Three times a day (TID) | ORAL | Status: DC | PRN

## 2014-10-11 NOTE — Progress Notes (Signed)
   Subjective:    Patient ID: Allison Mckinney, female    DOB: [birth year], 24 y.o.   MRN: [id number]  Back Pain This is a new problem. The current episode started yesterday. Exacerbated by: movement. She has tried bed rest and ice for the symptoms.  pain started yesterday. Woke pt up from sleep. Pain is in mid  Back on left side. Using ice pack.    had been doing a lot of physical work at [employer] No hx Woke up  Worse with movement,yawmns and coughs make it worse Tried rest and ice pack  she denies any injury.  Review of Systems  Musculoskeletal: Positive for back pain.   patient denies difficulty breathing pain is severe when she tries to move when she gets situated it's not as bad     Objective:   Physical Exam   tenderness in the mid back region near the left rhomboid lungs are clear respiratory rate normal heart is regular      Assessment & Plan:   severe mid back pain hurts with movement. - treat with pain medicine cautioned drowsiness muscle relaxers caution drowsiness & plan a toward these, cold or warm compresses, gentle massage and stretching, if not improving over the next 7 days follow-up. Doubt herniated disc but I cannot rule out out just yet , no work for the next 5 days

## 2015-02-13 ENCOUNTER — Ambulatory Visit (INDEPENDENT_AMBULATORY_CARE_PROVIDER_SITE_OTHER): Payer: Federal, State, Local not specified - PPO | Admitting: Family Medicine

## 2015-02-13 ENCOUNTER — Encounter: Payer: Self-pay | Admitting: Family Medicine

## 2015-02-13 VITALS — BP 128/80 | Temp 98.6°F | Ht 64.0 in | Wt 230.0 lb

## 2015-02-13 DIAGNOSIS — J329 Chronic sinusitis, unspecified: Secondary | ICD-10-CM

## 2015-02-13 MED ORDER — AZITHROMYCIN 250 MG PO TABS: ORAL_TABLET | ORAL | Status: DC

## 2015-02-13 NOTE — Progress Notes (Signed)
   Subjective:    Patient ID: Herbert PunMariah D Allen, female    DOB: Apr 29, 1990, 24 y.o.   MRN: [id number]  Cough This is a new problem. Episode onset: 4 days ago. Associated symptoms include nasal congestion and a sore throat. Treatments tried: OTC meds and vitamin C.    Pos sinus cong  pos h a  Coughing productive in nature  No lflare of asthma this wk though cough wheezy at times  Review of Systems  HENT: Positive for sore throat.   Respiratory: Positive for cough.    No vomiting or diarrhea    Objective:   Physical Exam  Alert vital stable cough congestion drainage noted stiffness noted pharynx normal neck supple lungs clear heart regular rate and rhythm.      Assessment & Plan:  Impression subacute rhinosinusitis and advised prescribed. Albuterol when necessary. Warning signs discussed WSL

## 2015-04-03 ENCOUNTER — Encounter: Payer: Federal, State, Local not specified - PPO | Admitting: Adult Health

## 2015-04-04 ENCOUNTER — Encounter: Payer: Self-pay | Admitting: Adult Health

## 2015-04-04 ENCOUNTER — Ambulatory Visit (INDEPENDENT_AMBULATORY_CARE_PROVIDER_SITE_OTHER): Payer: Federal, State, Local not specified - PPO | Admitting: Adult Health

## 2015-04-04 VITALS — BP 124/80 | HR 58 | Ht 66.0 in | Wt 229.0 lb

## 2015-04-04 DIAGNOSIS — Z3202 Encounter for pregnancy test, result negative: Secondary | ICD-10-CM | POA: Diagnosis not present

## 2015-04-04 DIAGNOSIS — Z30017 Encounter for initial prescription of implantable subdermal contraceptive: Secondary | ICD-10-CM | POA: Insufficient documentation

## 2015-04-04 LAB — POCT URINE PREGNANCY: Preg Test, Ur: NEGATIVE

## 2015-04-04 NOTE — Patient Instructions (Signed)
Use condoms x 4 weeks, keep clean and dry x 24 hours, no heavy lifting, keep steri strips on x 72 hours, Keep pressure dressing on x 24 hours. Follow up prn problems. Return in 3 months for pap and physical

## 2015-04-04 NOTE — Progress Notes (Signed)
Subjective:     Patient ID: Allison Mckinney, female   DOB: 05-Feb-1991, 25 y.o.   MRN: [id number]  HPI [name] is a 25 year old white female in for nexplanon insertion.No complaints.  Review of Systems Patient denies any headaches, hearing loss, fatigue, blurred vision, shortness of breath, chest pain, abdominal pain, problems with bowel movements, urination, or intercourse(not having sex). No joint pain or mood swings.For nexplanon insertion.   Reviewed past medical,surgical, social and family history. Reviewed medications and allergies.     Objective:   Physical Exam BP 124/80 mmHg  Pulse 58  Ht 5\' 6"  (1.676 m)  Wt 229 lb (103.874 kg)  BMI 36.98 kg/m2  LMP 04/03/2015 UPT negative, Consent signed, time out called. Left arm cleansed with betadine, and injected with 1.5 cc 2% lidocaine and waited til numb. Nexplanon easily inserted and steri strips applied.Rod easily palpated by provider and pt. Pressure dressing applied.    Assessment:     Nexplanon insertion lot [id number] exp 3/19    Plan:     Use condoms x 4 weeks, keep clean and dry x 24 hours, no heavy lifting, keep steri strips on x 72 hours, Keep pressure dressing on x 24 hours. Follow up prn problems.   Return in 3 months for pap and physical

## 2015-07-03 ENCOUNTER — Encounter: Payer: Self-pay | Admitting: *Deleted

## 2015-07-03 ENCOUNTER — Other Ambulatory Visit: Payer: Federal, State, Local not specified - PPO | Admitting: Adult Health

## 2015-08-30 ENCOUNTER — Ambulatory Visit (INDEPENDENT_AMBULATORY_CARE_PROVIDER_SITE_OTHER): Payer: Federal, State, Local not specified - PPO | Admitting: Nurse Practitioner

## 2015-08-30 ENCOUNTER — Encounter: Payer: Self-pay | Admitting: Nurse Practitioner

## 2015-08-30 ENCOUNTER — Encounter: Payer: Self-pay | Admitting: Family Medicine

## 2015-08-30 VITALS — BP 126/76 | Temp 98.5°F | Ht 64.75 in | Wt 217.1 lb

## 2015-08-30 DIAGNOSIS — J029 Acute pharyngitis, unspecified: Secondary | ICD-10-CM | POA: Diagnosis not present

## 2015-08-30 LAB — POCT RAPID STREP A (OFFICE): Rapid Strep A Screen: NEGATIVE

## 2015-08-30 NOTE — Progress Notes (Signed)
Subjective:  Presents for complaints of sore throat that began last night. No fever. Slight headache. Began having a cough this morning. One episode of posttussive vomiting. No diarrhea or abdominal pain. No wheezing. Slight ear pressure. Taking fluids well. Voiding normal limit. Has lost her voice.  Objective:   BP 126/76 mmHg  Temp(Src) 98.5 F (36.9 C) (Oral)  Ht 5' 4.75" (1.645 m)  Wt 217 lb 2 oz (98.487 kg)  BMI 36.40 kg/m2 NAD. Alert, oriented. TMs clear effusion, no erythema. Pharynx moderate erythema, RST negative. Neck supple with mild soft anterior adenopathy. Lungs clear. Occasional nonproductive cough noted. Heart regular rate rhythm. Abdomen soft nondistended nontender.  Assessment: Acute pharyngitis, unspecified etiology - Plan: POCT rapid strep A, Strep A DNA probe  Plan: Reviewed symptomatic care and warning signs. Call back in 48 hours if no improvement, sooner if worse. Throat culture pending.

## 2015-08-31 LAB — STREP A DNA PROBE: STREP GP A DIRECT, DNA PROBE: NEGATIVE

## 2017-03-10 ENCOUNTER — Ambulatory Visit: Payer: Self-pay | Admitting: Family Medicine

## 2017-03-10 ENCOUNTER — Encounter: Payer: Self-pay | Admitting: Family Medicine

## 2017-03-10 VITALS — BP 110/74 | Temp 98.4°F | Ht 64.75 in | Wt 224.0 lb

## 2017-03-10 DIAGNOSIS — J040 Acute laryngitis: Secondary | ICD-10-CM

## 2017-03-10 DIAGNOSIS — J029 Acute pharyngitis, unspecified: Secondary | ICD-10-CM

## 2017-03-10 MED ORDER — AMOXICILLIN 500 MG PO CAPS: 500.0000 mg | ORAL_CAPSULE | Freq: Three times a day (TID) | ORAL | 0 refills | Status: DC

## 2017-03-10 NOTE — Progress Notes (Signed)
   Subjective:    Patient ID: Allison PunMariah D Allen, female    DOB: 1990/10/30, 26 y.o.   MRN: [id number]  Sinusitis  This is a new problem. Associated symptoms include congestion, coughing, sneezing and a sore throat. Treatments tried: dayquil.    Pos cough and cong and drnge and din erngy    Not a major prob with nausea  ; Review of Systems  HENT: Positive for congestion, sneezing and sore throat.   Respiratory: Positive for cough.        Objective:   Physical Exam Alert, mild malaise. Hydration good Vitals stable. frontal/ maxillary tenderness evident positive nasal congestion. pharynx normal neck supple  lungs clear/no crackles or wheezes. heart regular in rhythm        Assessment & Plan:  Impression rhinosinusitis likely post viral, discussed with patient. plan antibiotics prescribed. Questions answered. Symptomatic care discussed. warning signs discussed. WSL

## 2017-03-12 ENCOUNTER — Encounter: Payer: Self-pay | Admitting: Family Medicine

## 2017-03-14 ENCOUNTER — Encounter: Payer: Self-pay | Admitting: Family Medicine

## 2017-03-14 ENCOUNTER — Ambulatory Visit (INDEPENDENT_AMBULATORY_CARE_PROVIDER_SITE_OTHER): Payer: Self-pay | Admitting: Family Medicine

## 2017-03-14 VITALS — BP 122/80 | Temp 98.4°F | Ht 64.75 in | Wt 220.0 lb

## 2017-03-14 DIAGNOSIS — J040 Acute laryngitis: Secondary | ICD-10-CM

## 2017-03-14 MED ORDER — PREDNISONE 10 MG PO TABS: ORAL_TABLET | ORAL | 0 refills | Status: DC

## 2017-03-14 MED ORDER — CEPHALEXIN 500 MG PO CAPS: 500.0000 mg | ORAL_CAPSULE | Freq: Four times a day (QID) | ORAL | 0 refills | Status: DC

## 2017-03-14 NOTE — Progress Notes (Signed)
   Subjective:    Patient ID: Allison PunMariah D Allen, female    DOB: [birth year], 26 y.o.   MRN: [id number]  Sinusitis  This is a new problem. Episode onset: dec 16th. Associated symptoms include coughing and a sore throat. Treatments tried: amoxil, dayquil, tussin     dayqil and ot tussin   soe cong in the chest  Patient has developed progressive hoarseness..  Cough productive.  Infection not as responsive to amoxicillin is patient hoped   Review of Systems  HENT: Positive for sore throat.   Respiratory: Positive for cough.        Objective:   Physical Exam Alert active good hydration.  HEENT mild nasal congestion pharynx slight erythema neck supple lungs bronchial cough heart regular rate and rhythm.     Assessment & Plan:  Impression progressive respiratory infection plan prednisone taper.  Switch to Keflex symptom care discussed warning signs discussed

## 2017-08-12 ENCOUNTER — Ambulatory Visit: Payer: Self-pay | Admitting: Family Medicine

## 2017-08-12 ENCOUNTER — Encounter: Payer: Self-pay | Admitting: Family Medicine

## 2017-08-12 VITALS — BP 122/82 | Temp 97.9°F | Ht 64.75 in | Wt 229.6 lb

## 2017-08-12 DIAGNOSIS — N3000 Acute cystitis without hematuria: Secondary | ICD-10-CM

## 2017-08-12 MED ORDER — CIPROFLOXACIN HCL 250 MG PO TABS: ORAL_TABLET | ORAL | 0 refills | Status: DC

## 2017-08-12 NOTE — Progress Notes (Signed)
   Subjective:    Patient ID: Allison Mckinney, female    DOB: 06-Sep-1990, 27 y.o.   MRN: [id number]  HPI  Patient arrives with dysuria that started yesterday- using AZO No fever no nausea   Increased fluid intake   No back pain  No nausea  No uti for couple yrs     Review of Systems No headache, no major weight loss or weight gain, no chest pain no back pain abdominal pain no change in bowel habits complete ROS otherwise negative     Objective:   Physical Exam Alert vitals stable, NAD. Blood pressure good on repeat. HEENT normal. Lungs clear. Heart regular rate and rhythm.  Urinalysis numerous white blood cells per high-power field       Assessment & Plan:  Impression urinary tract infection.  Antibiotics prescribed symptom care discussed

## 2018-02-16 ENCOUNTER — Telehealth: Payer: Self-pay | Admitting: Obstetrics & Gynecology

## 2018-02-16 NOTE — Telephone Encounter (Signed)
Spoke with pt. Pt had BP checked in arm that has Nexplanon. Pt had pain in that arm and pt is concerned that Nexplanon has moved. Advised can schedule an appt. Call transferred to front desk for appt. JSY

## 2018-02-16 NOTE — Telephone Encounter (Signed)
Patient called, stated she is having an issue with her nexplanon.  Stated she went to the Urgent Care for something and they put the cuff on the arm with the nexplanon, it was painful for two days afterwards, but not so much now.  She does have a bruise in that area.  She is concerned that this may have caused issues.  She wants to speak to a nurse.  [phone]

## 2018-02-27 ENCOUNTER — Encounter (INDEPENDENT_AMBULATORY_CARE_PROVIDER_SITE_OTHER): Payer: Self-pay

## 2018-02-27 ENCOUNTER — Ambulatory Visit: Payer: Commercial Managed Care - PPO | Admitting: Obstetrics and Gynecology

## 2018-02-27 ENCOUNTER — Encounter: Payer: Self-pay | Admitting: Obstetrics and Gynecology

## 2018-02-27 VITALS — BP 136/87 | HR 84 | Ht 65.0 in | Wt 246.4 lb

## 2018-02-27 DIAGNOSIS — Z3046 Encounter for surveillance of implantable subdermal contraceptive: Secondary | ICD-10-CM | POA: Diagnosis not present

## 2018-02-27 NOTE — Progress Notes (Signed)
Patient ID: Allison Mckinney, female   DOB: [birth year], 27 y.o.   MRN: [id number]    [facility]  @DATE @            Patient name: Allison Mckinney MRN [id number]  Date of birth: [birth year]  CC & HPI:  Allison Mckinney is a 27 y.o. female presenting today for was sick and went to ER 2 weeks ago. Had BP checked in left arm where NEXPLANON is and was painful. She had bruising. She is concerned that it was damaged. She is due for removal in January but does not want another one  ROS:  ROS  +contusion near nexplanon site from BP cuff, resolved -fever -chills All systems are negative except as noted in the HPI and PMH.   Pertinent History Reviewed:   Reviewed:  Medical         Past Medical History:  Diagnosis Date  . Acid reflux   . Anxiety   . Implanon removal 09/28/2014  . Recurrent cough   . Scoliosis                               Surgical Hx:   History reviewed. No pertinent surgical history. Medications: Reviewed & Updated - see associated section                       Current Outpatient Medications:  .  etonogestrel (NEXPLANON) 68 MG IMPL implant, 1 each by Subdermal route once., Disp: , Rfl:    Social History: Reviewed -  reports that she has never smoked. She has never used smokeless tobacco.  Objective Findings:  Vitals: Blood pressure 136/87, pulse 84, height 5\' 5"  (1.651 m), weight 246 lb 6.4 oz (111.8 kg).  PHYSICAL EXAMINATION General appearance - alert, well appearing, and in no distress, oriented to person, place, and time and anxious Mental status - alert, oriented to person, place, and time, a bit anxious, behavior, speech, dress, motor activity, and thought processes, affect appropriate to mood  PELVIC NOT DONE  Assessment & Plan:   A:  1.  Resolved concern of Nexplanon  2. Unexplained bruising from BP cuff near Nexplanon site. Resolved 3.   P:  1.  PRN f/u   By signing my name below, I, Arnette NorrisMari Johnson, attest that this documentation has been  prepared under the direction and in the presence of Tilda BurrowFerguson, Mehki Klumpp V, MD. Electronically Signed: Arnette NorrisMari Johnson Medical Scribe. 02/27/18. 12:55 PM.  I personally performed the services described in this documentation, which was SCRIBED in my presence. The recorded information has been reviewed and considered accurate. It has been edited as necessary during review. Tilda BurrowJohn V Navya Timmons, MD

## 2018-04-10 ENCOUNTER — Encounter: Payer: Self-pay | Admitting: Adult Health

## 2018-04-10 ENCOUNTER — Ambulatory Visit (INDEPENDENT_AMBULATORY_CARE_PROVIDER_SITE_OTHER): Payer: Commercial Managed Care - PPO | Admitting: Adult Health

## 2018-04-10 VITALS — BP 142/78 | HR 71 | Ht 65.0 in | Wt 245.2 lb

## 2018-04-10 DIAGNOSIS — Z3049 Encounter for surveillance of other contraceptives: Secondary | ICD-10-CM | POA: Diagnosis not present

## 2018-04-10 DIAGNOSIS — Z3046 Encounter for surveillance of implantable subdermal contraceptive: Secondary | ICD-10-CM | POA: Insufficient documentation

## 2018-04-10 DIAGNOSIS — Z3202 Encounter for pregnancy test, result negative: Secondary | ICD-10-CM

## 2018-04-10 DIAGNOSIS — Z30017 Encounter for initial prescription of implantable subdermal contraceptive: Secondary | ICD-10-CM

## 2018-04-10 LAB — POCT URINE PREGNANCY: PREG TEST UR: NEGATIVE

## 2018-04-10 MED ORDER — ETONOGESTREL 68 MG ~~LOC~~ IMPL
68.0000 mg | DRUG_IMPLANT | Freq: Once | SUBCUTANEOUS | Status: AC
  Administered 2018-04-10: 68 mg via SUBCUTANEOUS

## 2018-04-10 NOTE — Progress Notes (Addendum)
Patient ID: Allison Mckinney, female   DOB: April 16, 1990, [age] y.o.   MRN: [id number] History of Present Illness: Allison Mckinney is a 28 year old white female, recently married, in for nexplanon removal and reinsertion. 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 No pap in years. PCP is Lubertha SouthSteve Luking.   Current Medications, Allergies, Past Medical History, Past Surgical History, Family History and Social History were reviewed in [facility] electronic medical record.     Review of Systems:  For nexplanon removal and reinsertion She is nervous does not like needles    Physical Exam:BP (!) 142/78 (BP Location: Left Arm, Patient Position: Sitting, Cuff Size: Normal)   Pulse 71   Ht 5\' 5"  (1.651 m)   Wt 245 lb 3.2 oz (111.2 kg)   BMI 40.80 kg/m   UPT negative. General:  Well developed, well nourished, no acute distress Skin:  Warm and dry Psych:  No mood changes, alert and cooperative,seems happy PHQ 2 score 1. Fall risk is low. Consent signed, time out called. Left arm cleansed with betadine, and injected with 2.5 cc 1% lidocaine,(slow to numb)then injected with  2 cc 2% lidocaine and waited til numb.Under sterile technique a #11 blade was used to make small vertical incision, and rod easily removed with fingers, and new rod easily inserted, and easily palpated by provider and pt. Steri strips applied. Pressure dressing applied.   Impression:  1. Encounter for Nexplanon removal   2. Nexplanon insertion   3. Encounter for surveillance of Nexplanon subdermal contraceptive   LOt #[id number] exp [id number]   Plan: Use condoms x 2 weeks, keep clean and dry x 24 hours, no heavy lifting, keep steri strips on x 72 hours, Keep pressure dressing on x 24 hours. Follow up prn problems. Return in about 6 weeks for pap and physical.

## 2018-04-10 NOTE — Patient Instructions (Signed)
Use condoms x 2 weeks, keep clean and dry x 24 hours, no heavy lifting, keep steri strips on x 72 hours, Keep pressure dressing on x 24 hours. Follow up prn problems. Return for pap and physical in about 6 weeks

## 2018-04-21 ENCOUNTER — Encounter: Payer: Self-pay | Admitting: Family Medicine

## 2018-04-21 ENCOUNTER — Ambulatory Visit: Payer: Commercial Managed Care - PPO | Admitting: Family Medicine

## 2018-04-21 VITALS — BP 130/90 | Temp 97.4°F | Wt 244.4 lb

## 2018-04-21 DIAGNOSIS — J019 Acute sinusitis, unspecified: Secondary | ICD-10-CM

## 2018-04-21 MED ORDER — AMOXICILLIN 500 MG PO CAPS: 500.0000 mg | ORAL_CAPSULE | Freq: Three times a day (TID) | ORAL | 0 refills | Status: DC

## 2018-04-21 NOTE — Patient Instructions (Signed)
Sinusitis, Adult  Sinusitis is inflammation of your sinuses. Sinuses are hollow spaces in the bones around your face. Your sinuses are located:   Around your eyes.   In the middle of your forehead.   Behind your nose.   In your cheekbones.  Mucus normally drains out of your sinuses. When your nasal tissues become inflamed or swollen, mucus can become trapped or blocked. This allows bacteria, viruses, and fungi to grow, which leads to infection. Most infections of the sinuses are caused by a virus.  Sinusitis can develop quickly. It can last for up to 4 weeks (acute) or for more than 12 weeks (chronic). Sinusitis often develops after a cold.  What are the causes?  This condition is caused by anything that creates swelling in the sinuses or stops mucus from draining. This includes:   Allergies.   Asthma.   Infection from bacteria or viruses.   Deformities or blockages in your nose or sinuses.   Abnormal growths in the nose (nasal polyps).   Pollutants, such as chemicals or irritants in the air.   Infection from fungi (rare).  What increases the risk?  You are more likely to develop this condition if you:   Have a weak body defense system (immune system).   Do a lot of swimming or diving.   Overuse nasal sprays.   Smoke.  What are the signs or symptoms?  The main symptoms of this condition are pain and a feeling of pressure around the affected sinuses. Other symptoms include:   Stuffy nose or congestion.   Thick drainage from your nose.   Swelling and warmth over the affected sinuses.   Headache.   Upper toothache.   A cough that may get worse at night.   Extra mucus that collects in the throat or the back of the nose (postnasal drip).   Decreased sense of smell and taste.   Fatigue.   A fever.   Sore throat.   Bad breath.  How is this diagnosed?  This condition is diagnosed based on:   Your symptoms.   Your medical history.   A physical exam.   Tests to find out if your condition is  acute or chronic. This may include:  ? Checking your nose for nasal polyps.  ? Viewing your sinuses using a device that has a light (endoscope).  ? Testing for allergies or bacteria.  ? Imaging tests, such as an MRI or CT scan.  In rare cases, a bone biopsy may be done to rule out more serious types of fungal sinus disease.  How is this treated?  Treatment for sinusitis depends on the cause and whether your condition is chronic or acute.   If caused by a virus, your symptoms should go away on their own within 10 days. You may be given medicines to relieve symptoms. They include:  ? Medicines that shrink swollen nasal passages (topical intranasal decongestants).  ? Medicines that treat allergies (antihistamines).  ? A spray that eases inflammation of the nostrils (topical intranasal corticosteroids).  ? Rinses that help get rid of thick mucus in your nose (nasal saline washes).   If caused by bacteria, your health care provider may recommend waiting to see if your symptoms improve. Most bacterial infections will get better without antibiotic medicine. You may be given antibiotics if you have:  ? A severe infection.  ? A weak immune system.   If caused by narrow nasal passages or nasal polyps, you may need   to have surgery.  Follow these instructions at home:  Medicines   Take, use, or apply over-the-counter and prescription medicines only as told by your health care provider. These may include nasal sprays.   If you were prescribed an antibiotic medicine, take it as told by your health care provider. Do not stop taking the antibiotic even if you start to feel better.  Hydrate and humidify     Drink enough fluid to keep your urine pale yellow. Staying hydrated will help to thin your mucus.   Use a cool mist humidifier to keep the humidity level in your home above 50%.   Inhale steam for 10-15 minutes, 3-4 times a day, or as told by your health care provider. You can do this in the bathroom while a hot shower is  running.   Limit your exposure to cool or dry air.  Rest   Rest as much as possible.   Sleep with your head raised (elevated).   Make sure you get enough sleep each night.  General instructions     Apply a warm, moist washcloth to your face 3-4 times a day or as told by your health care provider. This will help with discomfort.   Wash your hands often with soap and water to reduce your exposure to germs. If soap and water are not available, use hand sanitizer.   Do not smoke. Avoid being around people who are smoking (secondhand smoke).   Keep all follow-up visits as told by your health care provider. This is important.  Contact a health care provider if:   You have a fever.   Your symptoms get worse.   Your symptoms do not improve within 10 days.  Get help right away if:   You have a severe headache.   You have persistent vomiting.   You have severe pain or swelling around your face or eyes.   You have vision problems.   You develop confusion.   Your neck is stiff.   You have trouble breathing.  Summary   Sinusitis is soreness and inflammation of your sinuses. Sinuses are hollow spaces in the bones around your face.   This condition is caused by nasal tissues that become inflamed or swollen. The swelling traps or blocks the flow of mucus. This allows bacteria, viruses, and fungi to grow, which leads to infection.   If you were prescribed an antibiotic medicine, take it as told by your health care provider. Do not stop taking the antibiotic even if you start to feel better.   Keep all follow-up visits as told by your health care provider. This is important.  This information is not intended to replace advice given to you by your health care provider. Make sure you discuss any questions you have with your health care provider.  Document Released: 03/11/2005 Document Revised: 08/11/2017 Document Reviewed: 08/11/2017  Elsevier Interactive Patient Education  2019 Elsevier Inc.

## 2018-04-21 NOTE — Progress Notes (Signed)
   Subjective:    Patient ID: Allison Mckinney, female    DOB: 28-May-1990, 28 y.o.   MRN: [id number]  Cough  This is a new problem. The current episode started in the past 7 days. The cough is non-productive. Associated symptoms include headaches, nasal congestion, rhinorrhea and a sore throat. Pertinent negatives include no chills, ear pain, fever or shortness of breath. Associated symptoms comments: Sneezing, ear pressure. Treatments tried: Mucinex, Tylenol Sinus Headache, black elderberry syrup, Vit C. The treatment provided moderate relief.   Pt had strep and flu test done at work this morning and they were both negative.   5 days ago started with cough, productive of phlegm. Reports sinus pressure and ear pressure. No fever. No body aches and chills. otc meds somewhat helpful.    Review of Systems  Constitutional: Negative for chills and fever.  HENT: Positive for congestion, rhinorrhea, sinus pressure and sore throat. Negative for ear pain.   Respiratory: Positive for cough. Negative for shortness of breath.   Neurological: Positive for headaches.       Objective:   Physical Exam Vitals signs and nursing note reviewed.  Constitutional:      General: She is not in acute distress.    Appearance: She is not toxic-appearing.  HENT:     Head: Normocephalic and atraumatic.     Right Ear: Tympanic membrane normal.     Left Ear: Tympanic membrane normal.     Nose: Congestion present.     Right Sinus: Frontal sinus tenderness present. No maxillary sinus tenderness.     Left Sinus: Frontal sinus tenderness present. No maxillary sinus tenderness.     Mouth/Throat:     Mouth: Mucous membranes are moist.     Pharynx: Oropharynx is clear.  Eyes:     General:        Right eye: No discharge.        Left eye: No discharge.  Neck:     Musculoskeletal: Neck supple. No neck rigidity.  Cardiovascular:     Rate and Rhythm: Normal rate and regular rhythm.     Heart sounds: Normal heart  sounds.  Pulmonary:     Effort: Pulmonary effort is normal. No respiratory distress.     Breath sounds: Wheezing (slight expiratory wheeze) present. No rales.  Lymphadenopathy:     Cervical: No cervical adenopathy.  Skin:    General: Skin is warm and dry.  Neurological:     Mental Status: She is alert and oriented to person, place, and time.           Assessment & Plan:  Acute rhinosinusitis  Discussed with patient still likely viral etiology at this point. Symptomatic care discussed. Printed rx for abx given, with instructions to fill if no improvement over the next 2-3 days. Very slight expiratory wheeze noted on exam, albuterol inhaler offered to patient but she declined. Will notify us if worsening. Warning signs discussed. F/u if symptoms worsen or fail to improve.

## 2018-04-24 ENCOUNTER — Telehealth: Payer: Self-pay | Admitting: Family Medicine

## 2018-04-24 ENCOUNTER — Other Ambulatory Visit: Payer: Self-pay | Admitting: Family Medicine

## 2018-04-24 MED ORDER — ALBUTEROL SULFATE HFA 108 (90 BASE) MCG/ACT IN AERS: 2.0000 | INHALATION_SPRAY | RESPIRATORY_TRACT | 2 refills | Status: DC | PRN

## 2018-04-24 NOTE — Telephone Encounter (Signed)
Pt saw Lillia Abed this Tuesday  States Lillia Abed told pt to call us if she needed a refill on her inhaler  [facility] is [facility]  Please advise & call pt when done

## 2018-04-24 NOTE — Telephone Encounter (Signed)
Sent in [facility] of [facility]ol [facility] [place]. Nurses tried to get in touch with patient to get more information on her status but was unable to reach her or leave a voicemail. At previous office visit warning signs were discussed with patient and she was given a printed antibiotic prescription with instructions to fill if not improving over the next couple days. Pt should go to urgent care or ED over weekend if significantly worse. Will try to contact again on Monday to see how she is doing.

## 2018-04-24 NOTE — Telephone Encounter (Signed)
Please advise 

## 2018-04-24 NOTE — Telephone Encounter (Signed)
Tried to call patient. Voicemail is full and unable to leave message.

## 2018-04-24 NOTE — Telephone Encounter (Signed)
Please advise. Thank you

## 2018-04-24 NOTE — Telephone Encounter (Signed)
Nurses, please call patient and see how she is doing. What symptoms is she still having? Are her symptoms worse or getting better? Shortness of breath or wheezing?  Did she fill the antibiotic? Thanks!

## 2018-04-27 NOTE — Telephone Encounter (Signed)
Telephone call -Mailbox is full 

## 2018-04-28 ENCOUNTER — Encounter: Payer: Self-pay | Admitting: Family Medicine

## 2018-04-28 NOTE — Telephone Encounter (Signed)
Telephone call- voicemail is full- we have been unable to get in touch with patient.

## 2018-04-28 NOTE — Telephone Encounter (Signed)
We have attempted to call patient without success to see how she is feeling since her last visit and to let her know we sent in the rx for albuterol per her request.   Letter has been sent to the patient dictating that if her symptoms are worse or not improving she should call the office to schedule a follow up visit. If she is having to use the inhaler frequently over the next several weeks she should also call to schedule a follow up visit.   Warning signs and instructions for follow up were also given to her at her last office visit on 04/21/18.

## 2018-06-11 ENCOUNTER — Telehealth: Payer: Self-pay | Admitting: Family Medicine

## 2018-06-11 ENCOUNTER — Other Ambulatory Visit: Payer: Self-pay | Admitting: *Deleted

## 2018-06-11 MED ORDER — CITALOPRAM HYDROBROMIDE 20 MG PO TABS: ORAL_TABLET | ORAL | 0 refills | Status: DC

## 2018-06-11 MED ORDER — RANITIDINE HCL 300 MG PO TABS: 300.0000 mg | ORAL_TABLET | Freq: Every day | ORAL | 4 refills | Status: DC

## 2018-06-11 NOTE — Telephone Encounter (Signed)
May start citalopram 20 mg Start off with 1/2 tablet daily for the first week Then 1 tablet thereafter #30 Needs to give update on how she is doing with this in the next 2 to 3 weeks If the patient is getting depressed or suicidal seek help immediately We can use a medication ongoing but I will not be doing refills until the patient sends Dr. Brett Canales a follow-up in a few weeks

## 2018-06-11 NOTE — Telephone Encounter (Signed)
Can she have refill on ranitdine?

## 2018-06-11 NOTE — Telephone Encounter (Signed)
Wants to start back on citalopram. Has not been on for at least 3 years. Because of the coronavirus and recently married and trying to adjust would like to go back on med.  Can't sleep at night. No thought of suicide or hurting her self.  Has been off of ranitidine for about 3 years also. Having burning in throat. Coughing up stomach fluid.   [facility] [place]

## 2018-06-11 NOTE — Telephone Encounter (Signed)
Requesting refill for  ranitidine (ZANTAC) 300 MG tablet   citalopram (CELEXA) 20 MG tablet   Pharmacy:  [id number] [place], [place] - [address] AT [place] [address]

## 2018-06-11 NOTE — Telephone Encounter (Signed)
Discussed with ptMay start citalopram 20 mg Start off with 1/2 tablet daily for the first week Then 1 tablet thereafter #30 Needs to give update on how she is doing with this in the next 2 to 3 weeks If the patient is getting depressed or suicidal seek help immediately We can use a medication ongoing but Dr. Lorin Picket will not be doing refills until the patient sends Dr. Brett Canales a follow-up in a few weeks. Pt verbalized understanding. Med sent to pharm and also ranitidine 300mg  sent to pharm.

## 2018-06-11 NOTE — Telephone Encounter (Signed)
I would double check with her pharmacy to see if her ranitidine is okay or if it was recalled if it is okay she may have 4 refills if it is not okay we will need to use Pepcid 20 mg daily #30 and 5 refills

## 2018-07-08 ENCOUNTER — Telehealth: Payer: Self-pay | Admitting: *Deleted

## 2018-07-08 NOTE — Telephone Encounter (Signed)
Let her know ranitidine gone, switch to pepcid 40 mg daily

## 2018-07-08 NOTE — Telephone Encounter (Signed)
Tried to call no answer. Voicemail full.  

## 2018-07-08 NOTE — Telephone Encounter (Signed)
Fax from [employer] [employer]. Pt requested refill on ranitidine 300mg  and med has been recalled. Pt last seen in jan for sick appt. I do not see where she has had a med check. I looked in chart past 2017.

## 2018-07-10 ENCOUNTER — Other Ambulatory Visit: Payer: Self-pay | Admitting: *Deleted

## 2018-07-10 MED ORDER — FAMOTIDINE 40 MG PO TABS: 40.0000 mg | ORAL_TABLET | Freq: Every day | ORAL | 5 refills | Status: DC

## 2018-07-10 NOTE — Telephone Encounter (Signed)
Pt notified and new med sent to pharm.

## 2018-07-16 ENCOUNTER — Ambulatory Visit: Payer: Commercial Managed Care - PPO | Admitting: Family Medicine

## 2018-07-16 ENCOUNTER — Other Ambulatory Visit: Payer: Self-pay

## 2018-08-01 ENCOUNTER — Other Ambulatory Visit: Payer: Self-pay | Admitting: Family Medicine

## 2018-08-03 NOTE — Telephone Encounter (Signed)
sched virtual visit, then may ref times one

## 2018-08-04 NOTE — Telephone Encounter (Signed)
Pt states she does not need a refill at this time due to missing some doses. Pt states she will call back to schedule an appt.

## 2019-04-26 ENCOUNTER — Encounter: Payer: Self-pay | Admitting: Family Medicine

## 2019-11-26 ENCOUNTER — Telehealth: Payer: Commercial Managed Care - PPO | Admitting: Family Medicine

## 2019-11-26 ENCOUNTER — Other Ambulatory Visit: Payer: Self-pay

## 2019-12-24 ENCOUNTER — Other Ambulatory Visit: Payer: Self-pay

## 2019-12-24 ENCOUNTER — Encounter (HOSPITAL_COMMUNITY): Payer: Self-pay

## 2019-12-24 ENCOUNTER — Telehealth: Payer: Self-pay | Admitting: *Deleted

## 2019-12-24 ENCOUNTER — Emergency Department (HOSPITAL_COMMUNITY)
Admission: EM | Admit: 2019-12-24 | Discharge: 2019-12-24 | Disposition: A | Discharge status: Home / Self Care | Payer: Commercial Managed Care - PPO | Attending: Emergency Medicine | Admitting: Emergency Medicine

## 2019-12-24 DIAGNOSIS — J038 Acute tonsillitis due to other specified organisms: Secondary | ICD-10-CM

## 2019-12-24 DIAGNOSIS — J039 Acute tonsillitis, unspecified: Secondary | ICD-10-CM | POA: Insufficient documentation

## 2019-12-24 DIAGNOSIS — R590 Localized enlarged lymph nodes: Secondary | ICD-10-CM | POA: Diagnosis present

## 2019-12-24 MED ORDER — ACETAMINOPHEN 500 MG PO TABS
1000.0000 mg | ORAL_TABLET | Freq: Once | ORAL | Status: AC
  Administered 2019-12-24: 1000 mg via ORAL
  Filled 2019-12-24: qty 2

## 2019-12-24 MED ORDER — DEXAMETHASONE SODIUM PHOSPHATE 10 MG/ML IJ SOLN
10.0000 mg | Freq: Once | INTRAMUSCULAR | Status: AC
  Administered 2019-12-24: 10 mg via INTRAMUSCULAR
  Filled 2019-12-24: qty 1

## 2019-12-24 NOTE — ED Provider Notes (Signed)
[facility] EMERGENCY DEPARTMENT Provider Note   CSN: [id number] Arrival date & time: 12/24/19  1626     History Chief Complaint  Patient presents with  . Sore Throat    Allison Mckinney is a 29 y.o. female.  Patient presents with sore throat and right ear fullness.  Patient was put on cefdinir and had a recent evaluation by [facility] including negative strep test, negative Covid and CT scan with tonsillitis no abscess.        Past Medical History:  Diagnosis Date  . Acid reflux   . Anxiety   . Implanon removal 09/28/2014  . Recurrent cough   . Scoliosis     Patient Active Problem List   Diagnosis Date Noted  . Encounter for Nexplanon removal 04/10/2018  . Encounter for surveillance of Nexplanon subdermal contraceptive 04/10/2018  . Nexplanon insertion 04/04/2015  . Implanon removal 09/28/2014  . Depression with anxiety 10/20/2012  . GERD (gastroesophageal reflux disease) 10/20/2012    History reviewed. No pertinent surgical history.   OB History    Gravida  0   Para  0   Term  0   Preterm  0   AB  0   Living  0     SAB  0   TAB  0   Ectopic  0   Multiple  0   Live Births              Family History  Problem Relation Age of Onset  . Anemia Mother   . Cancer Paternal Grandfather        liver  . Diabetes Maternal Grandfather   . Cancer Maternal Grandfather        lung  . Heart attack Father        open heart surgery    Social History   Tobacco Use  . Smoking status: Never Smoker  . Smokeless tobacco: Never Used  Vaping Use  . Vaping Use: Never used  Substance Use Topics  . Alcohol use: Yes    Comment: rare social  . Drug use: No    Home Medications Prior to Admission medications   Medication Sig Start Date End Date Taking? Authorizing Provider  albuterol (PROVENTIL HFA;VENTOLIN HFA) 108 (90 Base) MCG/ACT inhaler Inhale 2 puffs into the lungs every 4 (four) hours as needed for wheezing or shortness of breath. 04/24/18   Jeannine Boga, NP  amoxicillin (AMOXIL) 500 MG capsule Take 1 capsule (500 mg total) by mouth 3 (three) times daily. 04/21/18   Jeannine Boga, NP  citalopram (CELEXA) 20 MG tablet Take one half tablet for 7 days then increase to one tablet daily. 06/11/18   Babs Sciara, MD  etonogestrel (NEXPLANON) 68 MG IMPL implant 1 each by Subdermal route once.    [provider]  famotidine (PEPCID) 40 MG tablet Take 1 tablet (40 mg total) by mouth daily. 07/10/18   Merlyn Albert, MD  ranitidine (ZANTAC) 300 MG tablet Take 1 tablet (300 mg total) by mouth at bedtime. 06/11/18   Babs Sciara, MD    Allergies    Other  Review of Systems   Review of Systems  Constitutional: Negative for chills and fever.  HENT: Positive for congestion and sore throat.   Eyes: Negative for visual disturbance.  Respiratory: Negative for shortness of breath.   Cardiovascular: Negative for chest pain.  Gastrointestinal: Negative for abdominal pain and vomiting.  Genitourinary: Negative for dysuria and flank pain.  Musculoskeletal:  Negative for back pain, neck pain and neck stiffness.  Skin: Negative for rash.  Neurological: Negative for light-headedness and headaches.    Physical Exam Updated Vital Signs BP 134/88 (BP Location: Right Arm)   Pulse 84   Temp 99.2 F (37.3 C) (Oral)   Resp 15   Ht 5\' 5"  (1.651 m)   Wt 117.9 kg   SpO2 96%   BMI 43.27 kg/m   Physical Exam Vitals and nursing note reviewed.  Constitutional:      Appearance: She is well-developed.  HENT:     Head: Normocephalic and atraumatic.     Comments: Patient has mild right anterior cervical adenopathy.  Neck supple.  Full range of motion.    Nose: Congestion present.     Mouth/Throat:     Tonsils: No tonsillar exudate or tonsillar abscesses. 2+ on the right. 2+ on the left.  Eyes:     General:        Right eye: No discharge.        Left eye: No discharge.     Conjunctiva/sclera: Conjunctivae normal.  Neck:     Trachea:  No tracheal deviation.  Cardiovascular:     Rate and Rhythm: Normal rate and regular rhythm.  Pulmonary:     Effort: Pulmonary effort is normal.     Breath sounds: Normal breath sounds.  Abdominal:     General: There is no distension.     Palpations: Abdomen is soft.     Tenderness: There is no abdominal tenderness. There is no guarding.  Musculoskeletal:     Cervical back: Normal range of motion and neck supple.  Skin:    General: Skin is warm.     Findings: No rash.  Neurological:     Mental Status: She is alert and oriented to person, place, and time.     ED Results / Procedures / Treatments   Labs (all labs ordered are listed, but only abnormal results are displayed) Labs Reviewed - No data to display  EKG None  Radiology No results found.  Procedures Procedures (including critical care time)  Medications Ordered in ED Medications  dexamethasone (DECADRON) injection 10 mg (10 mg Intramuscular Given 12/24/19 1926)  acetaminophen (TYLENOL) tablet 1,000 mg (1,000 mg Oral Given 12/24/19 1925)    ED Course  I have reviewed the triage vital signs and the nursing notes.  Pertinent labs & imaging results that were available during my care of the patient were reviewed by me and considered in my medical decision making (see chart for details).    MDM Rules/Calculators/A&P                          Patient presents with clinically tonsillitis.  Reviewed medical records care everywhere CT scan that was done recently no acute abscess, tonsillitis.  Patient has negative recent Covid and negative strep test.  Discussed supportive care Decadron and Tylenol.  Patient has outpatient follow-up.  Final Clinical Impression(s) / ED Diagnoses Final diagnoses:  Acute tonsillitis due to other specified organisms    Rx / DC Orders ED Discharge Orders    None       02/23/20, MD 12/25/19 [phone]

## 2019-12-24 NOTE — ED Triage Notes (Addendum)
Pt was diagnosed with tonsillitis and adenoiditis on Wednesday. She is also having right ear pain. Was put on Cefdinir. Is taking this, but pain has progressively gotten worse. Got a CT on her neck on Wednesday at [facility]. She was negative Wednesday for COVID and strep.

## 2019-12-24 NOTE — Telephone Encounter (Signed)
Patient states she was heading to the ER for evaluation

## 2019-12-24 NOTE — Telephone Encounter (Signed)
Patient called and stated she made an Urgent care follow up for next week but does not think she can wait till then. Patient states she has had severe ear pain and throat pain with swollen lymph nodes in neck. Patient states she had a CT that did not show abscess but she still is in a lot of pain and having a lot of difficulties. Advised patient to go to the ER for evaluation and treatment. Patient verbalized understanding.

## 2019-12-24 NOTE — Telephone Encounter (Signed)
Advise pt on Monday at 8:30am call the office for a same day appt to be seen if not feeling better.  Take tylenol/ibuprofen for pain, salt water gargles and take the antibiotics given by the urgent care. Make sure to drink fluids and stay hydrated.  If not able to make it till Monday, then go to ER or urgent care again for re-evaluation.  Take care,   Dr. Ladona Ridgel

## 2019-12-24 NOTE — Discharge Instructions (Addendum)
Follow-up closely with your primary doctor.  You were given a steroid shot that lasts almost 3 days.  Use Tylenol and ibuprofen as needed for pain and fevers.  Return for breathing difficulties or new concerns.

## 2019-12-28 ENCOUNTER — Emergency Department (HOSPITAL_COMMUNITY): Payer: Commercial Managed Care - PPO

## 2019-12-28 ENCOUNTER — Emergency Department (HOSPITAL_COMMUNITY)
Admission: EM | Admit: 2019-12-28 | Discharge: 2019-12-28 | Disposition: A | Discharge status: Home / Self Care | Payer: Commercial Managed Care - PPO | Attending: Emergency Medicine | Admitting: Emergency Medicine

## 2019-12-28 ENCOUNTER — Encounter (HOSPITAL_COMMUNITY): Payer: Self-pay | Admitting: Emergency Medicine

## 2019-12-28 ENCOUNTER — Other Ambulatory Visit: Payer: Self-pay

## 2019-12-28 DIAGNOSIS — Z20822 Contact with and (suspected) exposure to covid-19: Secondary | ICD-10-CM | POA: Insufficient documentation

## 2019-12-28 DIAGNOSIS — M542 Cervicalgia: Secondary | ICD-10-CM | POA: Diagnosis not present

## 2019-12-28 DIAGNOSIS — H9201 Otalgia, right ear: Secondary | ICD-10-CM | POA: Diagnosis present

## 2019-12-28 DIAGNOSIS — J029 Acute pharyngitis, unspecified: Secondary | ICD-10-CM | POA: Diagnosis not present

## 2019-12-28 LAB — CBC
HCT: 45.2 % (ref 36.0–46.0)
Hemoglobin: 14.5 g/dL (ref 12.0–15.0)
MCH: 26.8 pg (ref 26.0–34.0)
MCHC: 32.1 g/dL (ref 30.0–36.0)
MCV: 83.4 fL (ref 80.0–100.0)
Platelets: 397 10*3/uL (ref 150–400)
RBC: 5.42 MIL/uL — ABNORMAL HIGH (ref 3.87–5.11)
RDW: 13.1 % (ref 11.5–15.5)
WBC: 11.2 10*3/uL — ABNORMAL HIGH (ref 4.0–10.5)
nRBC: 0 % (ref 0.0–0.2)

## 2019-12-28 LAB — BASIC METABOLIC PANEL
Anion gap: 11 (ref 5–15)
BUN: 9 mg/dL (ref 6–20)
CO2: 25 mmol/L (ref 22–32)
Calcium: 9.7 mg/dL (ref 8.9–10.3)
Chloride: 100 mmol/L (ref 98–111)
Creatinine, Ser: 0.68 mg/dL (ref 0.44–1.00)
GFR calc non Af Amer: >60 mL/min (ref >60)
Glucose, Bld: 85 mg/dL (ref 70–99)
Potassium: 4.5 mmol/L (ref 3.5–5.1)
Sodium: 136 mmol/L (ref 135–145)

## 2019-12-28 LAB — MONONUCLEOSIS SCREEN: Mono Screen: NEGATIVE

## 2019-12-28 LAB — GROUP A STREP BY PCR: Group A Strep by PCR: NOT DETECTED

## 2019-12-28 LAB — RESPIRATORY PANEL BY RT PCR (FLU A&B, COVID)
Influenza A by PCR: NEGATIVE
Influenza B by PCR: NEGATIVE
SARS Coronavirus 2 by RT PCR: NEGATIVE

## 2019-12-28 MED ORDER — OXYCODONE-ACETAMINOPHEN 5-325 MG PO TABS
2.0000 | ORAL_TABLET | Freq: Once | ORAL | Status: AC
  Administered 2019-12-28: 2 via ORAL
  Filled 2019-12-28: qty 2

## 2019-12-28 MED ORDER — DEXAMETHASONE SODIUM PHOSPHATE 10 MG/ML IJ SOLN
10.0000 mg | Freq: Once | INTRAMUSCULAR | Status: AC
  Administered 2019-12-28: 10 mg via INTRAVENOUS
  Filled 2019-12-28: qty 1

## 2019-12-28 MED ORDER — SODIUM CHLORIDE 0.9 % IV BOLUS
1000.0000 mL | Freq: Once | INTRAVENOUS | Status: AC
  Administered 2019-12-28: 1000 mL via INTRAVENOUS

## 2019-12-28 MED ORDER — IOHEXOL 300 MG/ML  SOLN
75.0000 mL | Freq: Once | INTRAMUSCULAR | Status: AC | PRN
  Administered 2019-12-28: 75 mL via INTRAVENOUS

## 2019-12-28 NOTE — Discharge Instructions (Addendum)
Please return for any problem. Follow up with your regular care provider on Thursday of this week as instructed.

## 2019-12-28 NOTE — ED Triage Notes (Signed)
Pt arrives to ED with complaints of ongoing right ear pain and right jaw pain. Pt recently dx with tonsillitis and adenoiditis and is on antibiotcs. Pt with recent CT scan that's shows no abscess. Pt states pain continues to get worse, pt in severe pain in triage.

## 2019-12-28 NOTE — ED Provider Notes (Signed)
[facility] EMERGENCY DEPARTMENT Provider Note   CSN: [id number] Arrival date & time: 12/28/19  1001     History Chief Complaint  Patient presents with  . Tonsillitis  . Ear Pain    Allison Mckinney is a 29 y.o. female.  HPI    29 year old female complaint of right ear pain and sore throat for 8 days.  She was seen multiple times last week.  She had a strep screen that was negative.  CT of the neck performed- cannot see results.  Covid reported negative.  Patient continues to have pain right side of neck. She reports difficulty swallowing.  NO fever or chills, nausea vomiting, diarrhea, cough or dyspnea.  Patient reports nexplanon in place  Patient on omnicef for empiric abx Past Medical History:  Diagnosis Date  . Acid reflux   . Anxiety   . Implanon removal 09/28/2014  . Recurrent cough   . Scoliosis     Patient Active Problem List   Diagnosis Date Noted  . Encounter for Nexplanon removal 04/10/2018  . Encounter for surveillance of Nexplanon subdermal contraceptive 04/10/2018  . Nexplanon insertion 04/04/2015  . Implanon removal 09/28/2014  . Depression with anxiety 10/20/2012  . GERD (gastroesophageal reflux disease) 10/20/2012    History reviewed. No pertinent surgical history.   OB History    Gravida  0   Para  0   Term  0   Preterm  0   AB  0   Living  0     SAB  0   TAB  0   Ectopic  0   Multiple  0   Live Births              Family History  Problem Relation Age of Onset  . Anemia Mother   . Cancer Paternal Grandfather        liver  . Diabetes Maternal Grandfather   . Cancer Maternal Grandfather        lung  . Heart attack Father        open heart surgery    Social History   Tobacco Use  . Smoking status: Never Smoker  . Smokeless tobacco: Never Used  Vaping Use  . Vaping Use: Never used  Substance Use Topics  . Alcohol use: Yes    Comment: rare social  . Drug use: No    Home Medications Prior to  Admission medications   Medication Sig Start Date End Date Taking? Authorizing Provider  albuterol (PROVENTIL HFA;VENTOLIN HFA) 108 (90 Base) MCG/ACT inhaler Inhale 2 puffs into the lungs every 4 (four) hours as needed for wheezing or shortness of breath. 04/24/18  Yes Jeannine Boga, NP  cefdinir (OMNICEF) 300 MG capsule Take 300 mg by mouth 2 (two) times daily.   Yes [provider]  etonogestrel (NEXPLANON) 68 MG IMPL implant 1 each by Subdermal route once.   Yes [provider]  ibuprofen (ADVIL) 800 MG tablet Take 800 mg by mouth every 8 (eight) hours.   Yes [provider]  ofloxacin (FLOXIN) 0.3 % OTIC solution Place 5 drops into the right ear 2 (two) times daily.   Yes [provider]  tiZANidine (ZANAFLEX) 4 MG capsule Take 4 mg by mouth daily.   Yes [provider]  citalopram (CELEXA) 20 MG tablet Take one half tablet for 7 days then increase to one tablet daily. Patient not taking: Reported on 12/28/2019 06/11/18   Babs Sciara, MD  famotidine (  PEPCID) 40 MG tablet Take 1 tablet (40 mg total) by mouth daily. Patient not taking: Reported on 12/28/2019 07/10/18   Merlyn Albert, MD  ranitidine (ZANTAC) 300 MG tablet Take 1 tablet (300 mg total) by mouth at bedtime. Patient not taking: Reported on 12/28/2019 06/11/18   Babs Sciara, MD    Allergies    Other  Review of Systems   Review of Systems  All other systems reviewed and are negative.   Physical Exam Updated Vital Signs BP (!) 148/81   Pulse 86   Temp 98 F (36.7 C) (Oral)   Resp 17   Ht 1.651 m (5\' 5" )   Wt 117.9 kg   SpO2 97%   BMI 43.27 kg/m   Physical Exam Vitals and nursing note reviewed.  Constitutional:      General: She is not in acute distress.    Appearance: She is obese. She is not ill-appearing.  HENT:     Right Ear: Tympanic membrane, ear canal and external ear normal.     Left Ear: Tympanic membrane, ear canal and external ear normal.     Nose:  Nose normal.     Mouth/Throat:     Mouth: Mucous membranes are moist.     Pharynx: Oropharynx is clear.     Comments: Unable to visualize posterior pharynx Eyes:     Extraocular Movements: Extraocular movements intact.     Pupils: Pupils are equal, round, and reactive to light.  Neck:     Comments: Diffuse right sided neck pain and submandibular ttp Cardiovascular:     Rate and Rhythm: Normal rate and regular rhythm.     Pulses: Normal pulses.     Heart sounds: Normal heart sounds.  Pulmonary:     Effort: Pulmonary effort is normal.     Breath sounds: Normal breath sounds.  Abdominal:     General: Abdomen is flat.     Palpations: Abdomen is soft.  Musculoskeletal:        General: Normal range of motion.     Cervical back: Normal range of motion.  Skin:    General: Skin is warm.     Capillary Refill: Capillary refill takes less than 2 seconds.  Neurological:     General: No focal deficit present.     Mental Status: She is alert. Mental status is at baseline.  Psychiatric:        Mood and Affect: Mood normal.        Behavior: Behavior normal.     ED Results / Procedures / Treatments   Labs (all labs ordered are listed, but only abnormal results are displayed) Labs Reviewed  CBC - Abnormal; Notable for the following components:      Result Value   WBC 11.2 (*)    RBC 5.42 (*)    All other components within normal limits  GROUP A STREP BY PCR  RESPIRATORY PANEL BY RT PCR (FLU A&B, COVID)  BASIC METABOLIC PANEL  MONONUCLEOSIS SCREEN    EKG None  Radiology No results found.  Procedures Procedures (including critical care time)  Medications Ordered in ED Medications  sodium chloride 0.9 % bolus 1,000 mL (1,000 mLs Intravenous New Bag/Given 12/28/19 1439)  dexamethasone (DECADRON) injection 10 mg (10 mg Intravenous Given 12/28/19 1439)  oxyCODONE-acetaminophen (PERCOCET/ROXICET) 5-325 MG per tablet 2 tablet (2 tablets Oral Given 12/28/19 1440)    ED Course  I  have reviewed the triage vital signs and the nursing notes.  Pertinent  labs & imaging results that were available during my care of the patient were reviewed by me and considered in my medical decision making (see chart for details).    MDM Rules/Calculators/A&P                          Plan repeat strep, mono, covid Repeat ct neck pending IV fluids, decadron infusing Discussed with Dr. Erin Hearing who will reevaluate patient  Final Clinical Impression(s) / ED Diagnoses Final diagnoses:  Sore throat    Rx / DC Orders ED Discharge Orders    None       Margarita Grizzle, MD 12/28/19 1541

## 2019-12-28 NOTE — ED Provider Notes (Signed)
Patient seen after prior ED provider.    Patient's work-up in the ED tonight was without any significant acute pathology.  Patient does understand need for close follow-up.  She has an already arranged appointment with her PCP on Thursday of this week.  Reports close follow-up is stressed.  Strict return precautions given and understood.     Wynetta Fines, MD 12/28/19 [phone]

## 2019-12-28 NOTE — ED Notes (Signed)
Called for Pt No answer 

## 2019-12-30 ENCOUNTER — Ambulatory Visit: Payer: Commercial Managed Care - PPO | Admitting: Family Medicine

## 2019-12-30 ENCOUNTER — Other Ambulatory Visit: Payer: Self-pay

## 2019-12-30 ENCOUNTER — Encounter: Payer: Self-pay | Admitting: Family Medicine

## 2019-12-30 VITALS — BP 122/86 | HR 84 | Temp 97.1°F | Ht 65.0 in | Wt 258.0 lb

## 2019-12-30 DIAGNOSIS — H9201 Otalgia, right ear: Secondary | ICD-10-CM

## 2019-12-30 MED ORDER — TRAMADOL HCL 50 MG PO TABS: ORAL_TABLET | ORAL | 0 refills | Status: DC

## 2019-12-30 MED ORDER — AZITHROMYCIN 250 MG PO TABS: ORAL_TABLET | ORAL | 0 refills | Status: DC

## 2019-12-30 NOTE — Progress Notes (Signed)
Patient ID: Allison Mckinney, female    DOB: [birth year], 29 y.o.   MRN: [id number]   Chief Complaint  Patient presents with  . Otalgia   Subjective:  CC: right ear pain  HPI  right side ear and jaw pain. Headaches. Tingling in tongue on right back side. Felt swollen at times. Started one week ago.   Went to urgent care last about 11 days ago for neck pain on right side. Had ct scan of neck.  Also went to ED on 10/1 and 10/5 and was told needs referral to ENT.  Summary of events: has gone to UC x 2, [facility] ED, [facility] ED and has been treated with antibiotics, steroid injections x 3, and ear drops. Has had 2 CT scans that show mild prominence of lymphoid tissues. She needs ENT referral. Has called 2 ENT offices and hoping we can expedite this process.  Negative: Covid, strep, Flu A & B, and mono from previous.   Medical History [facility] has a past medical history of Acid reflux, Anxiety, Implanon removal (09/28/2014), Recurrent cough, and Scoliosis.   Outpatient Encounter Medications as of 12/30/2019  Medication Sig  . cefdinir (OMNICEF) 300 MG capsule Take 300 mg by mouth 2 (two) times daily.  Marland Kitchen etonogestrel (NEXPLANON) 68 MG IMPL implant 1 each by Subdermal route once.  Marland Kitchen ibuprofen (ADVIL) 800 MG tablet Take 800 mg by mouth every 8 (eight) hours.  Marland Kitchen ofloxacin (FLOXIN) 0.3 % OTIC solution Place 5 drops into the right ear 2 (two) times daily.  Marland Kitchen tiZANidine (ZANAFLEX) 4 MG capsule Take 4 mg by mouth daily.  . [DISCONTINUED] albuterol (PROVENTIL HFA;VENTOLIN HFA) 108 (90 Base) MCG/ACT inhaler Inhale 2 puffs into the lungs every 4 (four) hours as needed for wheezing or shortness of breath.  Marland Kitchen azithromycin (ZITHROMAX) 250 MG tablet Take 2 tablets today, then one tablet for 4 days.  . traMADol (ULTRAM) 50 MG tablet Use one time per day as needed for pain.  . [DISCONTINUED] citalopram (CELEXA) 20 MG tablet Take one half tablet for 7 days then increase to one tablet daily. (Patient not taking: Reported on  12/28/2019)  . [DISCONTINUED] famotidine (PEPCID) 40 MG tablet Take 1 tablet (40 mg total) by mouth daily. (Patient not taking: Reported on 12/28/2019)  . [DISCONTINUED] ranitidine (ZANTAC) 300 MG tablet Take 1 tablet (300 mg total) by mouth at bedtime. (Patient not taking: Reported on 12/28/2019)   No facility-administered encounter medications on file as of 12/30/2019.     Review of Systems  Constitutional: Negative for chills and fever.  HENT: Positive for ear pain. Negative for ear discharge, hearing loss and tinnitus.   Respiratory: Negative.   Cardiovascular: Negative.   Gastrointestinal: Negative.   Hematological: Positive for adenopathy.       Right cervical tenderness     Vitals BP 122/86   Pulse 84   Temp (!) 97.1 F (36.2 C)   Ht 5\' 5"  (1.651 m)   Wt 258 lb (117 kg)   SpO2 97%   BMI 42.93 kg/m   Objective:   Physical Exam Vitals and nursing note reviewed.  Constitutional:      General: She is not in acute distress.    Appearance: She is not ill-appearing or toxic-appearing.  HENT:     Right Ear: Tympanic membrane normal.     Left Ear: Tympanic membrane normal.     Nose: Nose normal.     Mouth/Throat:     Mouth: Mucous membranes are moist.  Pharynx: Oropharynx is clear.  Neck:     Comments: On right only.  Cardiovascular:     Rate and Rhythm: Normal rate and regular rhythm.     Heart sounds: Normal heart sounds.  Pulmonary:     Effort: Pulmonary effort is normal.     Breath sounds: Normal breath sounds.  Lymphadenopathy:     Cervical: Cervical adenopathy present.     Right cervical: Superficial cervical adenopathy present.     Left cervical: No superficial cervical adenopathy.  Skin:    General: Skin is warm and dry.  Neurological:     Mental Status: She is alert and oriented to person, place, and time.  Psychiatric:        Mood and Affect: Mood normal.        Behavior: Behavior normal.        Thought Content: Thought content normal.         Judgment: Judgment normal.      Assessment and Plan   1. Ear pain, right - azithromycin (ZITHROMAX) 250 MG tablet; Take 2 tablets today, then one tablet for 4 days.  Dispense: 6 tablet; Refill: 0 - traMADol (ULTRAM) 50 MG tablet; Use one time per day as needed for pain.  Dispense: 6 tablet; Refill: 0   Considering she has experienced treatment failure and continues to have ear pain and cervical node tenderness on right, will treat with azithromycin while we make an urgent referral ENT. She will continue to call around on her own to see if she can get an appointment faster.   Her last CBC shows slight WBC elevation at 11.2.  The pain is affecting her ability to sleep, mild pain medication sent for short duration.   Consulted with Dr. Gerda Diss while caring for this patient today.  Agrees with plan of care discussed today. Understands warning signs to seek further care: fevers, worsening pain, changes in hearing.  Understands to follow-up if she continues to worsen and she cannot get into see ENT. We will make urgent ENT referral on her behalf. She will follow-up in one month here, but knows she can cancel that appointment if she is under the care of ENT.   Dorena Bodo, FNP-C 12/30/2019

## 2019-12-30 NOTE — Patient Instructions (Signed)
Start Z-pak today, stop Cefdinir. Take Tramadol sparingly for pain We will start ENT referral process and you should continue to look too. Let us know if you need [place].    Earache, Adult An earache, or ear pain, can be caused by many things, including:  An infection.  Ear wax buildup.  Ear pressure.  Something in the ear that should not be there (foreign body).  A sore throat.  Tooth problems.  Jaw problems. Treatment of the earache will depend on the cause. If the cause is not clear or cannot be determined, you may need to watch your symptoms until your earache goes away or until a cause is found. Follow these instructions at home: Medicines  Take or apply over-the-counter and prescription medicines only as told by your health care provider.  If you were prescribed an antibiotic medicine, use it as told by your health care provider. Do not stop using the antibiotic even if you start to feel better.  Do not put anything in your ear other than medicine that is prescribed by your health care provider. Managing pain If directed, apply heat to the affected area as often as told by your health care provider. Use the heat source that your health care provider recommends, such as a moist heat pack or a heating pad.  Place a towel between your skin and the heat source.  Leave the heat on for 20-30 minutes.  Remove the heat if your skin turns bright red. This is especially important if you are unable to feel pain, heat, or cold. You may have a greater risk of getting burned. If directed, put ice on the affected area as often as told by your health care provider. To do this:      Put ice in a plastic bag.  Place a towel between your skin and the bag.  Leave the ice on for 20 minutes, 2-3 times a day. General instructions  Pay attention to any changes in your symptoms.  Try resting in an upright position instead of lying down. This may help to reduce pressure in your ear and  relieve pain.  Chew gum if it helps to relieve your ear pain.  Treat any allergies as told by your health care provider.  Drink enough fluid to keep your urine pale yellow.  It is up to you to get the results of any tests that were done. Ask your health care provider, or the department that is doing the tests, when your results will be ready.  Keep all follow-up visits as told by your health care provider. This is important. Contact a health care provider if:  Your pain does not improve within 2 days.  Your earache gets worse.  You have new symptoms.  You have a fever. Get help right away if you:  Have a severe headache.  Have a stiff neck.  Have trouble swallowing.  Have redness or swelling behind your ear.  Have fluid or blood coming from your ear.  Have hearing loss.  Feel dizzy. Summary  An earache, or ear pain, can be caused by many things.  Treatment of the earache will depend on the cause. Follow recommendations from your health care provider to treat your ear pain.  If the cause is not clear or cannot be determined, you may need to watch your symptoms until your earache goes away or until a cause is found.  Keep all follow-up visits as told by your health care provider. This is  important. This information is not intended to replace advice given to you by your health care provider. Make sure you discuss any questions you have with your health care provider. Document Revised: 10/17/2018 Document Reviewed: 10/17/2018 Elsevier Patient Education  2020 [employer].

## 2020-01-06 ENCOUNTER — Other Ambulatory Visit: Payer: Self-pay | Admitting: Otolaryngology

## 2020-01-31 ENCOUNTER — Ambulatory Visit: Payer: Commercial Managed Care - PPO | Admitting: Family Medicine

## 2020-02-09 ENCOUNTER — Encounter: Payer: Self-pay | Admitting: Family Medicine

## 2020-02-09 ENCOUNTER — Ambulatory Visit (INDEPENDENT_AMBULATORY_CARE_PROVIDER_SITE_OTHER): Payer: Commercial Managed Care - PPO | Admitting: Family Medicine

## 2020-02-09 ENCOUNTER — Other Ambulatory Visit: Payer: Self-pay

## 2020-02-09 VITALS — BP 112/74 | HR 94 | Temp 97.1°F | Ht 63.75 in | Wt 252.6 lb

## 2020-02-09 DIAGNOSIS — K219 Gastro-esophageal reflux disease without esophagitis: Secondary | ICD-10-CM | POA: Diagnosis not present

## 2020-02-09 DIAGNOSIS — F419 Anxiety disorder, unspecified: Secondary | ICD-10-CM | POA: Diagnosis not present

## 2020-02-09 DIAGNOSIS — M5416 Radiculopathy, lumbar region: Secondary | ICD-10-CM

## 2020-02-09 DIAGNOSIS — Z8739 Personal history of other diseases of the musculoskeletal system and connective tissue: Secondary | ICD-10-CM

## 2020-02-09 MED ORDER — LORAZEPAM 0.5 MG PO TABS: 0.5000 mg | ORAL_TABLET | Freq: Two times a day (BID) | ORAL | 0 refills | Status: DC | PRN

## 2020-02-09 MED ORDER — BUPROPION HCL ER (SR) 150 MG PO TB12: 150.0000 mg | ORAL_TABLET | Freq: Every day | ORAL | 0 refills | Status: DC

## 2020-02-09 MED ORDER — OMEPRAZOLE 20 MG PO CPDR: 20.0000 mg | DELAYED_RELEASE_CAPSULE | Freq: Every day | ORAL | 3 refills | Status: DC

## 2020-02-09 NOTE — Progress Notes (Signed)
Patient ID: Allison Mckinney, female    DOB: 1990/11/09, 29 y.o.   MRN: [id number]   Chief Complaint  Patient presents with  . Annual Exam   Subjective:    HPI  The patient comes in today for a wellness visit.  A review of their health history was completed.  A review of medications was also completed.  Any needed refills; none at this time  Eating habits: trying to be healthy   Falls/  MVA accidents in past few months: none  Regular exercise: regular exercise at work  Specialist pt sees on regular basis: none  Preventative health issues were discussed.   Additional concerns: anxiety med? Was on something but felt like a zombie. scoliosis has not been evaluated in about 15 years; seen masseuse and she noticed some issues. Acid reflux issues  Pt stating having concern of scoliosis, wondering if needing it rechecked. Used to see [facility] in past, not needing to cont to recheck it. occ pain radiating down rt leg, last year, not happening now. Seeing massage therapy. Lower back pain on right.   Anxiety- Was on celexa in past in 2104 and in 2020. Felt like a "zombie" and dec energy when she was on it. Feeling like needing a medication during holidays and with mother in law coming into town.  Pt not wanting to take celexa.  Medical History Allison Mckinney has a past medical history of Acid reflux, Anxiety, Implanon removal (09/28/2014), Recurrent cough, and Scoliosis.   Outpatient Encounter Medications as of 02/09/2020  Medication Sig  . etonogestrel (NEXPLANON) 68 MG IMPL implant 1 each by Subdermal route once.  Marland Kitchen ibuprofen (ADVIL) 800 MG tablet Take 800 mg by mouth every 8 (eight) hours.  Marland Kitchen buPROPion (WELLBUTRIN SR) 150 MG 12 hr tablet Take 1 tablet (150 mg total) by mouth daily.  Marland Kitchen LORazepam (ATIVAN) 0.5 MG tablet Take 1 tablet (0.5 mg total) by mouth 2 (two) times daily as needed for anxiety.  Marland Kitchen omeprazole (PRILOSEC) 20 MG capsule Take 1 capsule (20 mg total) by mouth daily.   . [DISCONTINUED] azithromycin (ZITHROMAX) 250 MG tablet Take 2 tablets today, then one tablet for 4 days.  . [DISCONTINUED] cefdinir (OMNICEF) 300 MG capsule Take 300 mg by mouth 2 (two) times daily.  . [DISCONTINUED] ofloxacin (FLOXIN) 0.3 % OTIC solution Place 5 drops into the right ear 2 (two) times daily.  . [DISCONTINUED] tiZANidine (ZANAFLEX) 4 MG capsule Take 4 mg by mouth daily.  . [DISCONTINUED] traMADol (ULTRAM) 50 MG tablet Use one time per day as needed for pain.   No facility-administered encounter medications on file as of 02/09/2020.     Review of Systems  Constitutional: Negative for chills and fever.  HENT: Negative for congestion, rhinorrhea and sore throat.   Respiratory: Negative for cough, shortness of breath and wheezing.   Cardiovascular: Negative for chest pain and leg swelling.  Gastrointestinal: Negative for abdominal pain, diarrhea, nausea and vomiting.  Genitourinary: Negative for dysuria and frequency.  Musculoskeletal: Positive for back pain. Negative for arthralgias.  Skin: Negative for rash.  Neurological: Negative for dizziness, weakness and headaches.  Psychiatric/Behavioral: Negative for dysphoric mood, sleep disturbance and suicidal ideas. The patient is nervous/anxious.      Vitals BP 112/74   Pulse 94   Temp (!) 97.1 F (36.2 C)   Ht 5' 3.75" (1.619 m)   Wt 252 lb 9.6 oz (114.6 kg)   SpO2 98%   BMI 43.70 kg/m   Objective:  Physical Exam Vitals and nursing note reviewed.  Constitutional:      Appearance: Normal appearance.  HENT:     Head: Normocephalic and atraumatic.     Nose: Nose normal.     Mouth/Throat:     Mouth: Mucous membranes are moist.     Pharynx: Oropharynx is clear.  Eyes:     Extraocular Movements: Extraocular movements intact.     Conjunctiva/sclera: Conjunctivae normal.     Pupils: Pupils are equal, round, and reactive to light.  Cardiovascular:     Rate and Rhythm: Normal rate and regular rhythm.      Pulses: Normal pulses.     Heart sounds: Normal heart sounds.  Pulmonary:     Effort: Pulmonary effort is normal. No respiratory distress.     Breath sounds: Normal breath sounds. No wheezing, rhonchi or rales.  Musculoskeletal:        General: Normal range of motion.     Right lower leg: No edema.     Left lower leg: No edema.     Comments: +ttp over lumbar bilateral paraspinal area. Normal inspection.  Neg slr bilaterally.   Skin:    General: Skin is warm and dry.     Findings: No lesion or rash.  Neurological:     General: No focal deficit present.     Mental Status: She is alert and oriented to person, place, and time.  Psychiatric:        Behavior: Behavior normal.        Thought Content: Thought content normal.        Judgment: Judgment normal.     Comments: +anxious mood      Assessment and Plan   1. Lumbar back pain with radiculopathy affecting right lower extremity - DG Lumbar Spine Complete; Future  2. Anxiety - buPROPion (WELLBUTRIN SR) 150 MG 12 hr tablet; Take 1 tablet (150 mg total) by mouth daily.  Dispense: 30 tablet; Refill: 0 - LORazepam (ATIVAN) 0.5 MG tablet; Take 1 tablet (0.5 mg total) by mouth 2 (two) times daily as needed for anxiety.  Dispense: 30 tablet; Refill: 0  3. Gastroesophageal reflux disease, unspecified whether esophagitis present - omeprazole (PRILOSEC) 20 MG capsule; Take 1 capsule (20 mg total) by mouth daily.  Dispense: 30 capsule; Refill: 3  4. History of scoliosis   Lumbar pain- will order xray.  Use tylenol or ibuprofen prn.  Heat/ice 3x per day.  Anxiety- will start wellbutrin and pt to take ativan prn for anxiety.  Reviewed with pt not a good long term decision to treat anxiety with benzodiazepines. Pt in agreement.  gerd- stable. will cont with omeprazole.  F/u 4 wks or prn.

## 2020-02-16 ENCOUNTER — Telehealth: Payer: Self-pay | Admitting: Family Medicine

## 2020-02-16 MED ORDER — OMEPRAZOLE 40 MG PO CPDR: DELAYED_RELEASE_CAPSULE | ORAL | 5 refills | Status: DC

## 2020-02-16 NOTE — Telephone Encounter (Signed)
Medication sent to pharmacy. Voicemail is full; unable to leave message 

## 2020-02-16 NOTE — Addendum Note (Signed)
Addended by: Marlowe Shores on: 02/16/2020 01:53 PM   Modules accepted: Orders

## 2020-02-16 NOTE — Telephone Encounter (Signed)
Ok, please send 40mg  omeprazole daily. 30 tab with 5 refills.   Thx.  Dr. 

## 2020-02-16 NOTE — Telephone Encounter (Signed)
PA attempted for Omeprazole 20 mg. PA denied due to not meeting coverage guidelines. Pantoprazole, esomeprazole or omeprazole are the preferred formulary agents if high dose (greater than or equal to 80 mg total daily dose) PPI therapy is required. Please advise. Thank you

## 2020-02-19 ENCOUNTER — Encounter: Payer: Self-pay | Admitting: Family Medicine

## 2020-03-04 ENCOUNTER — Other Ambulatory Visit: Payer: Self-pay | Admitting: Family Medicine

## 2020-03-04 DIAGNOSIS — F419 Anxiety disorder, unspecified: Secondary | ICD-10-CM

## 2020-03-06 NOTE — Telephone Encounter (Signed)
02/09/20 was seen for wellness

## 2020-03-09 ENCOUNTER — Ambulatory Visit: Payer: Commercial Managed Care - PPO | Admitting: Family Medicine

## 2021-04-24 ENCOUNTER — Other Ambulatory Visit (HOSPITAL_COMMUNITY)
Admission: RE | Admit: 2021-04-24 | Discharge: 2021-04-24 | Disposition: A | Discharge status: Home / Self Care | Payer: Self-pay | Source: Ambulatory Visit | Attending: Women's Health | Admitting: Women's Health

## 2021-04-24 ENCOUNTER — Other Ambulatory Visit: Payer: Self-pay

## 2021-04-24 ENCOUNTER — Ambulatory Visit (INDEPENDENT_AMBULATORY_CARE_PROVIDER_SITE_OTHER): Payer: Self-pay | Admitting: Women's Health

## 2021-04-24 ENCOUNTER — Encounter: Payer: Self-pay | Admitting: Women's Health

## 2021-04-24 VITALS — BP 138/79 | HR 83 | Ht 65.0 in | Wt 283.4 lb

## 2021-04-24 DIAGNOSIS — Z124 Encounter for screening for malignant neoplasm of cervix: Secondary | ICD-10-CM | POA: Insufficient documentation

## 2021-04-24 DIAGNOSIS — Z3046 Encounter for surveillance of implantable subdermal contraceptive: Secondary | ICD-10-CM

## 2021-04-24 NOTE — Progress Notes (Signed)
° °  NEXPLANON REMOVAL Patient name: Allison Mckinney MRN [id number]  Date of birth: [birth year] Subjective Findings:   Allison Mckinney is a 31 y.o. G0P0000 Caucasian female being seen today for removal of a Nexplanon. Her Nexplanon was placed 04/10/18.  She desires removal because past due. Signed copy of informed consent in chart. Has had 3 nexplanons over the last 9 years. Periods were regular prior to 1st nexplanon, reports she has gained ~100lbs.   No LMP recorded (lmp unknown). Patient has had an implant. Last pap prior to 2014. Results were:  neg per her report, ok doing one today The planned method of family planning is none, may try for pregnancy  Depression screen [facility] 2/9 02/09/2020 04/10/2018  Decreased Interest 1 0  Down, Depressed, Hopeless 1 1  PHQ - 2 Score 2 1  Altered sleeping 2 -  Tired, decreased energy 1 -  Change in appetite 1 -  Feeling bad or failure about yourself  1 -  Trouble concentrating 0 -  Moving slowly or fidgety/restless 0 -  Suicidal thoughts 1 -  PHQ-9 Score 8 -  Difficult doing work/chores Not difficult at all -     GAD 7 : Generalized Anxiety Score 02/09/2020  Nervous, Anxious, on Edge 0  Control/stop worrying 0  Worry too much - different things 1  Trouble relaxing 1  Restless 0  Easily annoyed or irritable 2  Afraid - awful might happen 0  Total GAD 7 Score 4  Anxiety Difficulty Not difficult at all     Pertinent History Reviewed:   Reviewed past medical,surgical, social, obstetrical and family history.  Reviewed problem list, medications and allergies. Objective Findings & Procedure:    Vitals:   04/24/21 1410  BP: 138/79  Pulse: 83  Weight: 283 lb 6.4 oz (128.5 kg)  Height: 5\' 5"  (1.651 m)  Body mass index is 47.16 kg/m.  No results found for this or any previous visit (from the past 24 hour(s)).   Time out was performed.  Nexplanon site identified.  Area prepped in usual sterile fashon. One cc of 2% lidocaine was used to  anesthetize the area at the distal end of the implant. A small stab incision was made right beside the implant on the distal portion.  The Nexplanon rod was grasped using hemostats and removed without difficulty.  There was less than 3 cc blood loss. There were no complications.  Steri-strips were applied over the small incision and a pressure bandage was applied.  The patient tolerated the procedure well.  Pelvic: thin prep pap obtained, , RN chaperone Assessment & Plan:   1) Nexplanon removal She was instructed to keep the area clean and dry, remove pressure bandage in 24 hours, and keep insertion site covered with the steri-strip for 3-5 days.   Follow-up PRN problems.  2) Screening for cervical cancer> pap today  No orders of the defined types were placed in this encounter.   Follow-up: Return in about 1 year (around 04/24/2022) for Physical.  04/26/2022 CNM, [facility] 04/24/2021 2:55 PM

## 2021-04-26 LAB — CYTOLOGY - PAP
Comment: NEGATIVE
Diagnosis: NEGATIVE
High risk HPV: NEGATIVE

## 2021-12-04 IMAGING — CT CT NECK W/ CM
4 series · 14 of 33 positions shown, 17 images · IV contrast (APPLIED)
Comparison: 12/22/2019

CLINICAL DATA: Right ear pain, recent diagnosis of tonsillitis on
antibiotics

EXAM:
CT NECK WITH CONTRAST
TECHNIQUE: Multidetector CT imaging of the neck was performed using the
standard protocol following the bolus administration of intravenous
contrast.
CONTRAST:  75mL OMNIPAQUE IOHEXOL 300 MG/ML  SOLN

[Series 3: neck 2.0 i31s 3 · axial · 0.50mm/px · z∈[-314,-154]mm · 5 of 121 slices shown, 7 images]
[im 21/121  soft-tissue]
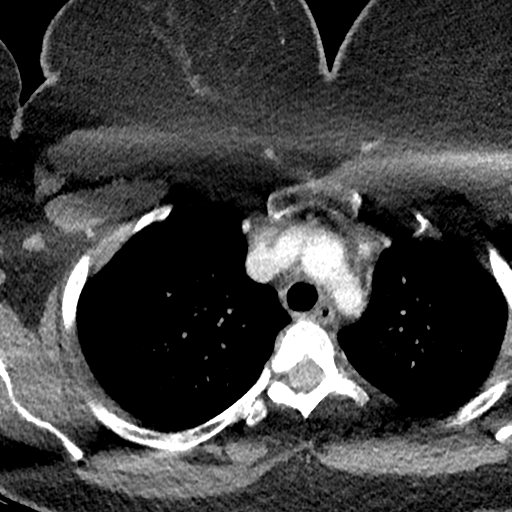
[im 21/121  bone]
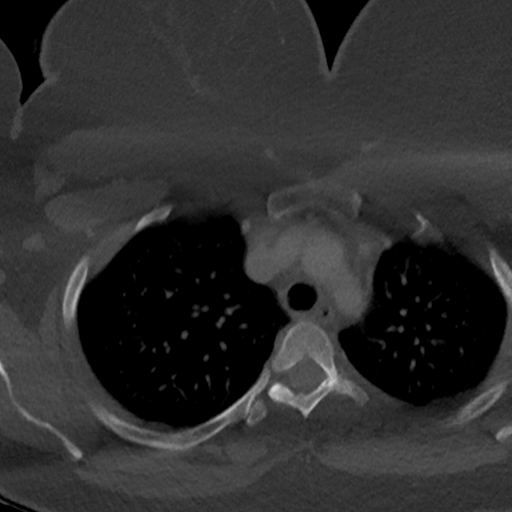
[im 41/121  bone]
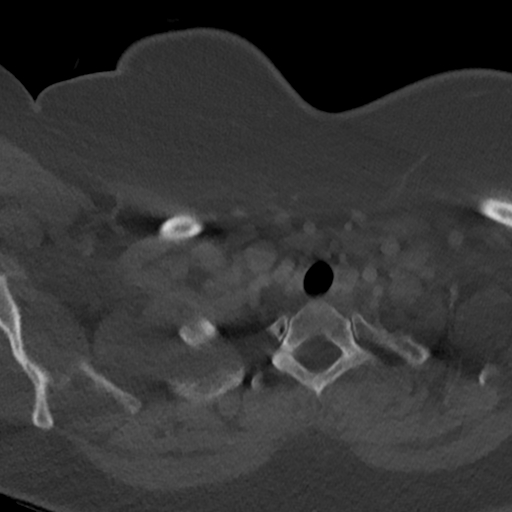
[im 61/121  bone]
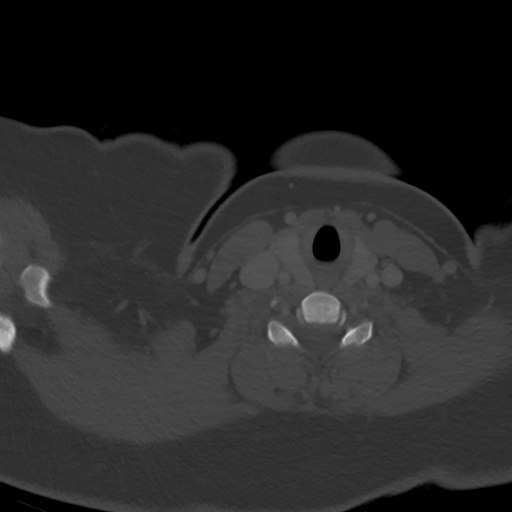
[im 81/121  bone]
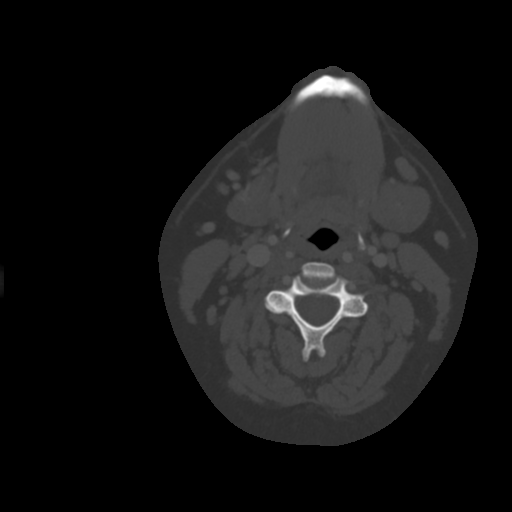
[im 101/121  soft-tissue]
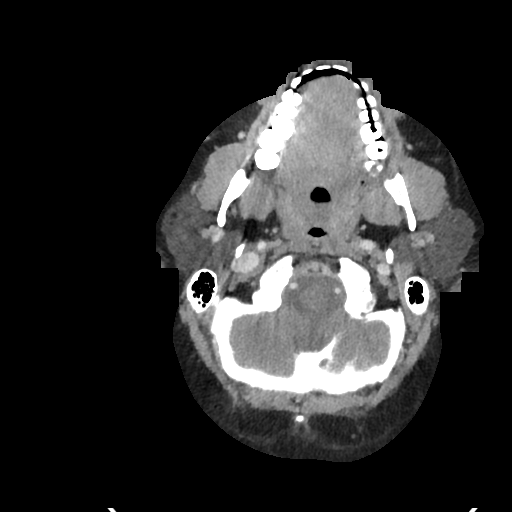
[im 101/121  bone]
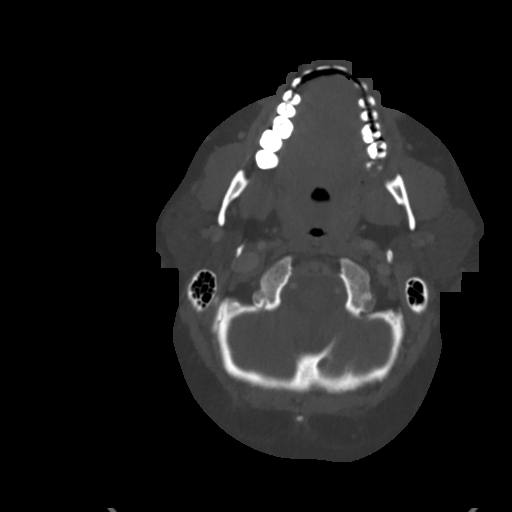

[Series 7: coronal st · coronal · 0.47mm/px · 3 of 101 slices shown]
[im 21/101  bone]
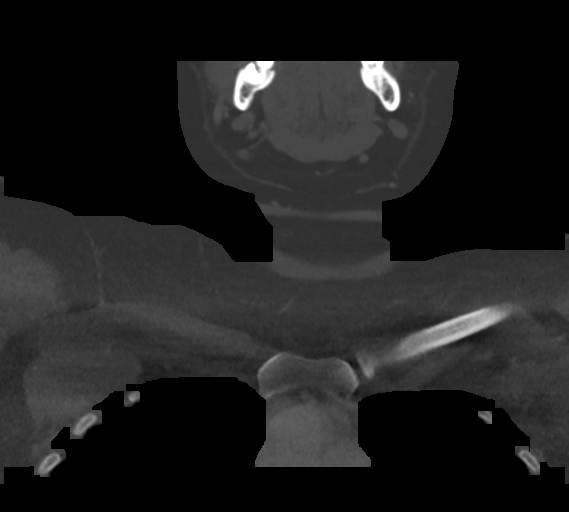
[im 41/101  bone]
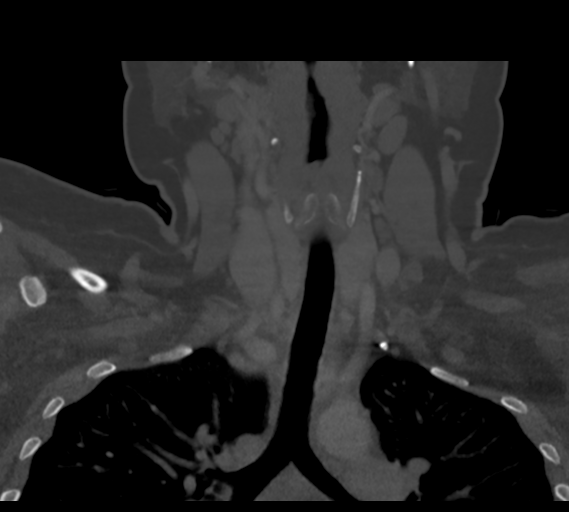
[im 61/101  bone]
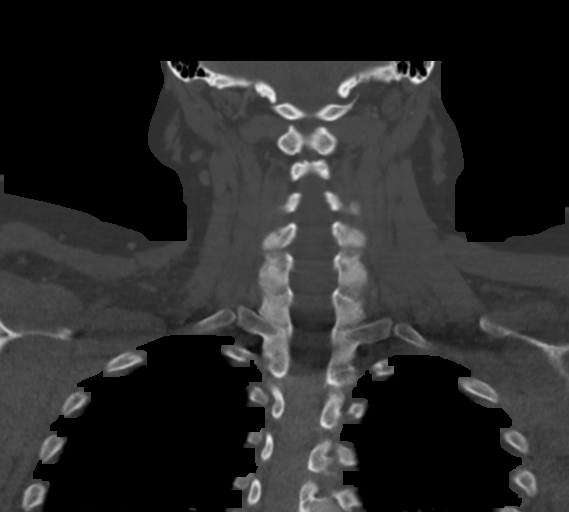

[Series 8: sagittal st · sagittal · 0.53mm/px · 5 of 87 slices shown, 6 images]
[im 29/87  bone]
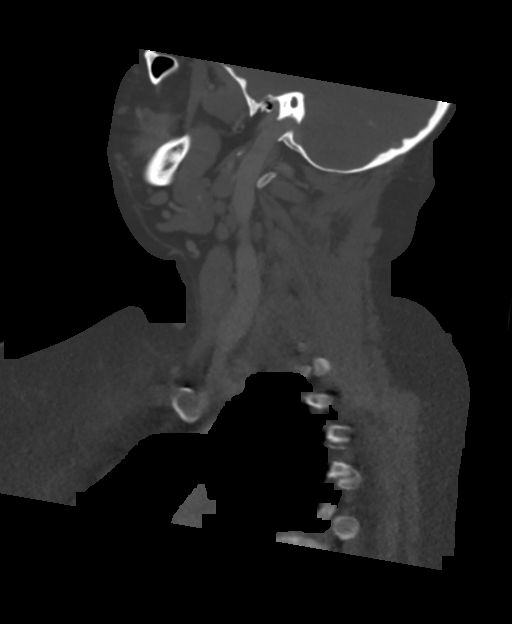
[im 36/87  bone]
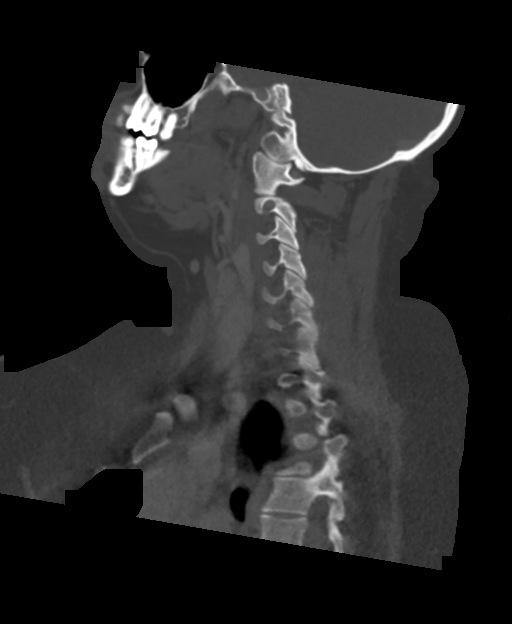
[im 44/87  soft-tissue]
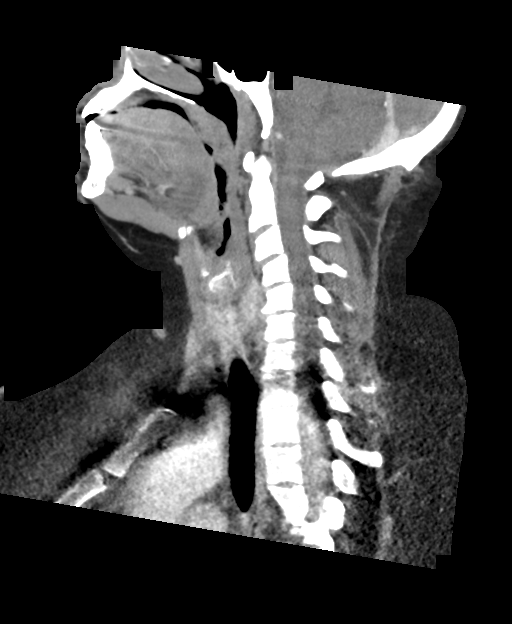
[im 44/87  bone]
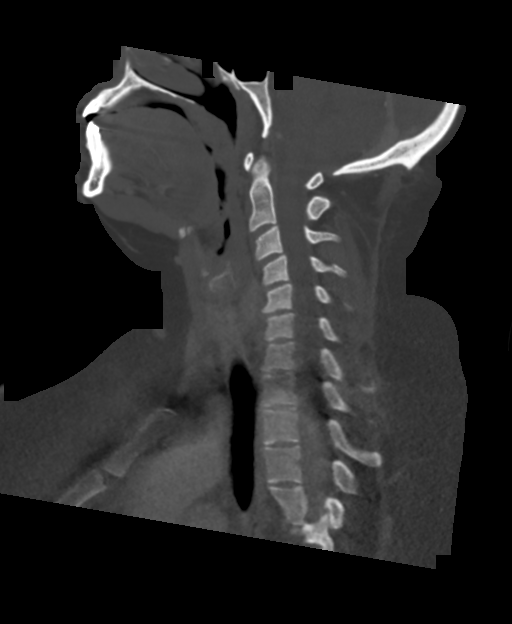
[im 51/87  bone]
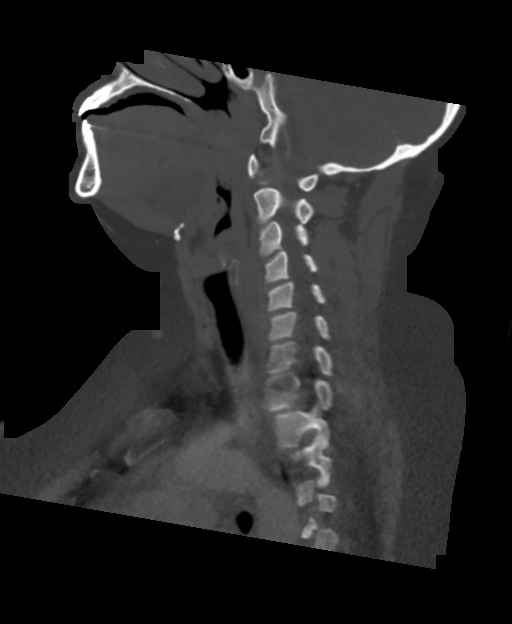
[im 58/87  bone]
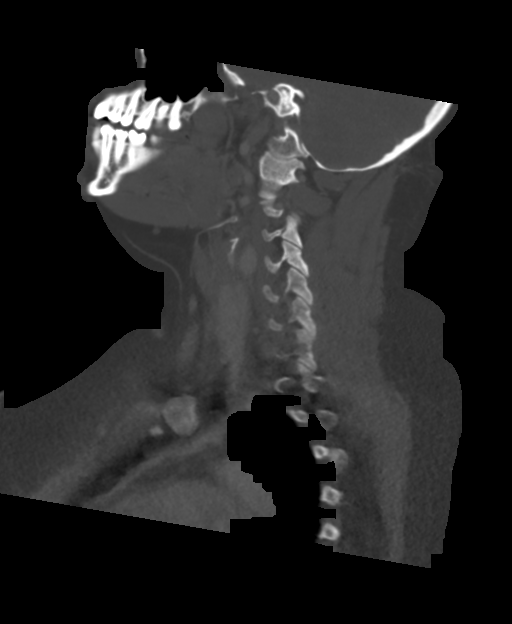

[Series 9: orthogonal st · axial · 0.24mm/px · 1 of 127 slices shown]
[im 22/127  bone]
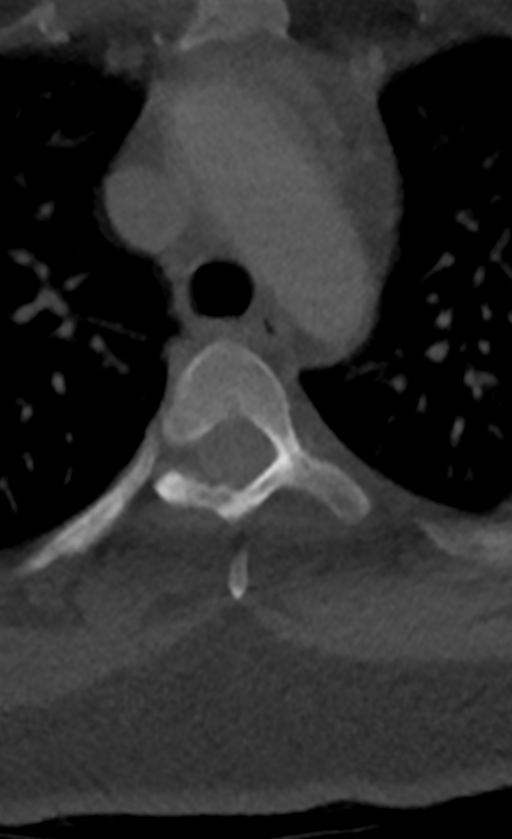

[14 of 33 positions shown; findings below may reference images not displayed]

FINDINGS: Pharynx and larynx: Mild prominence of the palatine and lingual
tonsils without heterogeneous enhancement or evidence of abscess.
There is no infiltration of the parapharyngeal fat. Patent airway.

Salivary glands: Unremarkable.

Thyroid: Normal.

Lymph nodes: Mildly prominent cervical lymph nodes are again
identified and are likely reactive.

Vascular: Major neck vessels are patent.

Limited intracranial: No abnormal enhancement.

Visualized orbits: Not included.

Mastoids and visualized paranasal sinuses: Visualized portions are
aerated.

Skeleton: No acute abnormality.

Upper chest: Included upper lungs remain clear.

Other: None.
IMPRESSION: No significant change since recent prior study. Unchanged mild
prominence of oropharyngeal lymphoid tissues and cervical lymph
nodes. Otherwise no significant inflammatory changes or evidence of
abscess.

## 2024-02-05 ENCOUNTER — Ambulatory Visit
Admission: EM | Admit: 2024-02-05 | Discharge: 2024-02-05 | Disposition: A | Discharge status: Home / Self Care | Payer: Self-pay | Attending: Nurse Practitioner | Admitting: Nurse Practitioner

## 2024-02-05 ENCOUNTER — Emergency Department (HOSPITAL_COMMUNITY): Payer: Self-pay

## 2024-02-05 ENCOUNTER — Other Ambulatory Visit: Payer: Self-pay

## 2024-02-05 ENCOUNTER — Emergency Department (HOSPITAL_COMMUNITY)
Admission: EM | Admit: 2024-02-05 | Discharge: 2024-02-05 | Disposition: A | Discharge status: Home / Self Care | Payer: Self-pay | Attending: Emergency Medicine | Admitting: Emergency Medicine

## 2024-02-05 ENCOUNTER — Ambulatory Visit: Payer: Self-pay

## 2024-02-05 DIAGNOSIS — R10A2 Flank pain, left side: Secondary | ICD-10-CM | POA: Insufficient documentation

## 2024-02-05 DIAGNOSIS — R109 Unspecified abdominal pain: Secondary | ICD-10-CM

## 2024-02-05 DIAGNOSIS — D72829 Elevated white blood cell count, unspecified: Secondary | ICD-10-CM | POA: Insufficient documentation

## 2024-02-05 LAB — BASIC METABOLIC PANEL WITH GFR
Anion gap: 11 (ref 5–15)
BUN: 10 mg/dL (ref 6–20)
CO2: 28 mmol/L (ref 22–32)
Calcium: 9.5 mg/dL (ref 8.9–10.3)
Chloride: 99 mmol/L (ref 98–111)
Creatinine, Ser: 0.7 mg/dL (ref 0.44–1.00)
GFR, Estimated: >60 mL/min (ref >60)
Glucose, Bld: 85 mg/dL (ref 70–99)
Potassium: 4.7 mmol/L (ref 3.5–5.1)
Sodium: 138 mmol/L (ref 135–145)

## 2024-02-05 LAB — CBC
HCT: 43.6 % (ref 36.0–46.0)
Hemoglobin: 14.1 g/dL (ref 12.0–15.0)
MCH: 27.2 pg (ref 26.0–34.0)
MCHC: 32.3 g/dL (ref 30.0–36.0)
MCV: 84.2 fL (ref 80.0–100.0)
Platelets: 427 K/uL — ABNORMAL HIGH (ref 150–400)
RBC: 5.18 MIL/uL — ABNORMAL HIGH (ref 3.87–5.11)
RDW: 13.6 % (ref 11.5–15.5)
WBC: 16 K/uL — ABNORMAL HIGH (ref 4.0–10.5)
nRBC: 0 % (ref 0.0–0.2)

## 2024-02-05 LAB — URINALYSIS, ROUTINE W REFLEX MICROSCOPIC
Bilirubin Urine: NEGATIVE
Glucose, UA: NEGATIVE mg/dL
Hgb urine dipstick: NEGATIVE
Ketones, ur: NEGATIVE mg/dL
Nitrite: NEGATIVE
Protein, ur: NEGATIVE mg/dL
Specific Gravity, Urine: 1.017 (ref 1.005–1.030)
pH: 5 (ref 5.0–8.0)

## 2024-02-05 LAB — POCT URINE DIPSTICK
Bilirubin, UA: NEGATIVE
Blood, UA: NEGATIVE
Glucose, UA: NEGATIVE mg/dL
Ketones, POC UA: NEGATIVE mg/dL
Leukocytes, UA: NEGATIVE
Nitrite, UA: NEGATIVE
Protein Ur, POC: NEGATIVE mg/dL
Spec Grav, UA: >=1.03 — AB (ref 1.010–1.025)
Urobilinogen, UA: 0.2 U/dL
pH, UA: 5.5 (ref 5.0–8.0)

## 2024-02-05 LAB — POC URINE PREG, ED: Preg Test, Ur: NEGATIVE

## 2024-02-05 MED ORDER — IBUPROFEN 600 MG PO TABS: 600.0000 mg | ORAL_TABLET | Freq: Four times a day (QID) | ORAL | 0 refills | Status: AC | PRN

## 2024-02-05 MED ORDER — ONDANSETRON 4 MG PO TBDP: 4.0000 mg | ORAL_TABLET | Freq: Three times a day (TID) | ORAL | 0 refills | Status: AC | PRN

## 2024-02-05 MED ORDER — ONDANSETRON HCL 4 MG/2ML IJ SOLN
4.0000 mg | Freq: Once | INTRAMUSCULAR | Status: AC
  Administered 2024-02-05: 4 mg via INTRAVENOUS
  Filled 2024-02-05: qty 2

## 2024-02-05 MED ORDER — KETOROLAC TROMETHAMINE 15 MG/ML IJ SOLN
15.0000 mg | Freq: Once | INTRAMUSCULAR | Status: AC
  Administered 2024-02-05: 15 mg via INTRAVENOUS
  Filled 2024-02-05: qty 1

## 2024-02-05 MED ORDER — OXYCODONE HCL 5 MG PO TABS: 5.0000 mg | ORAL_TABLET | ORAL | 0 refills | Status: AC | PRN

## 2024-02-05 MED ORDER — CEPHALEXIN 500 MG PO CAPS: 500.0000 mg | ORAL_CAPSULE | Freq: Four times a day (QID) | ORAL | 0 refills | Status: AC

## 2024-02-05 MED ORDER — LACTATED RINGERS IV BOLUS
1000.0000 mL | Freq: Once | INTRAVENOUS | Status: AC
  Administered 2024-02-05: 1000 mL via INTRAVENOUS

## 2024-02-05 NOTE — Telephone Encounter (Signed)
  FYI Only or Action Required?: FYI only for provider: ED advised.  Patient was last seen in primary care on new patient.  Called Nurse Triage reporting Abdominal Pain.  Symptoms began several days ago.  Interventions attempted: Ice/heat application.  Symptoms are: gradually worsening.  Triage Disposition: Go to ED Now (or PCP Triage)  Patient/caregiver understands and will follow disposition?: Yes Copied from CRM #[id number]. Topic: Clinical - Red Word Triage >> Feb 05, 2024  3:10 PM Charlet HERO wrote: Red Word that prompted transfer to Nurse Triage: Patietn is calling about side pain for about a week she is stating gets worse when she eats left side and back. Stabbing pain that makes her feel nauseated. Dr Waddell alamo Reason for Disposition  Patient sounds very sick or weak to the triager  Answer Assessment - Initial Assessment Questions 1. LOCATION: Where does it hurt?      Left lower abdomen 2. RADIATION: Does the pain shoot anywhere else? (e.g., chest, back)     Radiates to back 3. ONSET: When did the pain begin? (e.g., minutes, hours or days ago)      Monday, three days ago 4. SUDDEN: Gradual or sudden onset?     gradual 5. PATTERN Does the pain come and go, or is it constant?     Worsened after meals 6. SEVERITY: How bad is the pain?  (e.g., Scale 1-10; mild, moderate, or severe)     6/10 7. RECURRENT SYMPTOM: Have you ever had this type of stomach pain before? If Yes, ask: When was the last time? and What happened that time?      denies 8. CAUSE: What do you think is causing the stomach pain? (e.g., gallstones, recent abdominal surgery)     unknown 9. RELIEVING/AGGRAVATING FACTORS: What makes it better or worse? (e.g., antacids, bending or twisting motion, bowel movement)     Heating pad, TENS unit 10. OTHER SYMPTOMS: Do you have any other symptoms? (e.g., back pain, diarrhea, fever, urination pain, vomiting)       Positive for Back pain, nausea 11.  PREGNANCY: Is there any chance you are pregnant? When was your last menstrual period?       LMP 01/26/2024  Protocols used: Abdominal Pain - Female-A-AH

## 2024-02-05 NOTE — Discharge Instructions (Addendum)
 We evaluated you for your flank pain.  Your CT scan shows a possible blockage where your kidney meets your ureter (tube that connects your kidney to your bladder).  There may be a kidney stone we cannot see or there may be a developmental abnormality.  We suspect that this is the cause of your pain.  We discussed this with the urologist Dr. Roseann, and he would like you to follow-up with him in clinic.  We think it is less likely that you have a new infection, but you do have some bacteria in your urine so we have prescribed you an antibiotic.  Please take this as prescribed.  We have sent a urine culture and we will call you if we need to change your antibiotic.  Please take Tylenol  (acetaminophen ) and Motrin  (ibuprofen ) for your symptoms at home.  You can take 1000 mg of Tylenol  every 6 hours and 600 mg of Motrin  every 6 hours as needed for your symptoms.  You can take these medicines together as needed, either at the same time, or alternating every 3 hours.  We have given you a small amount of oxycodone  which you can take for pain not relieved by Tylenol  and Motrin .  Do not drink alcohol, drive, or operate machinery when taking this medicine.  Have any new or worsening symptoms such as uncontrolled pain, worsening pain, lightness or dizziness, fainting, fevers or chills, painful urination, or any other new symptoms, please return to the emergency department.

## 2024-02-05 NOTE — ED Notes (Signed)
 Patient is being discharged from the Urgent Care and sent to the Emergency Department via POV . Per Etta Sierras NP, patient is in need of higher level of care due to Left Flank Pain. Patient is aware and verbalizes understanding of plan of care.  Vitals:   02/05/24 1752  BP: 132/85  Pulse: 65  Resp: 16  Temp: 98.1 F (36.7 C)  SpO2: 97%

## 2024-02-05 NOTE — ED Triage Notes (Signed)
 Pt complains of left flank pain x 4 days. Nausea, no vomiting.

## 2024-02-05 NOTE — Discharge Instructions (Signed)
Please go to the emergency department for further evaluation.

## 2024-02-05 NOTE — ED Triage Notes (Signed)
 Pt states left side pain for the past 4 days.  States it is worse when she eats.  States she used the heating pad, tens unit and massage gun at home with no relief.

## 2024-02-05 NOTE — ED Provider Notes (Signed)
 [facility]    CSN: [id number] Arrival date & time: 02/05/24  1714      History   Chief Complaint Chief Complaint  Patient presents with   side pain    HPI Allison Mckinney is a 33 y.o. female.   The history is provided by the patient.   Patient presents for complaints of left side/flank pain has been present for the past several days.  Patient states that over the past 24 hours, her symptoms have worsened.  She rates her pain 6/10 at present.  Patient states that her symptoms are pain worsening with movement and after eating.  Patient denies injury, or trauma.  She states that she has had ongoing nausea.  Patient further denies fever, chills, abdominal pain, diarrhea, constipation, urinary frequency, urgency, hesitancy, or hematuria.  Patient states that the area over her left side and flank are tender when the areas are touched.  States she has used a heating pad, a TENS unit, and massage gun at home with minimal relief.  Past Medical History:  Diagnosis Date   Acid reflux    Anxiety    Implanon  removal 09/28/2014   Recurrent cough    Scoliosis     Patient Active Problem List   Diagnosis Date Noted   Ear pain, right 12/30/2019   Depression with anxiety 10/20/2012   GERD (gastroesophageal reflux disease) 10/20/2012    History reviewed. No pertinent surgical history.  OB History     Gravida  0   Para  0   Term  0   Preterm  0   AB  0   Living  0      SAB  0   IAB  0   Ectopic  0   Multiple  0   Live Births               Home Medications    Prior to Admission medications   Medication Sig Start Date End Date Taking? Authorizing Provider  etonogestrel  (NEXPLANON ) 68 MG IMPL implant 1 each by Subdermal route once.    [provider]    Family History Family History  Problem Relation Age of Onset   Anemia Mother    Cancer Paternal Grandfather        liver   Diabetes Maternal Grandfather    Cancer Maternal Grandfather         lung   Heart attack Father        open heart surgery    Social History Social History   Tobacco Use   Smoking status: Never   Smokeless tobacco: Never  Vaping Use   Vaping status: Never Used  Substance Use Topics   Alcohol use: Yes    Comment: rare social   Drug use: No     Allergies   Other   Review of Systems Review of Systems Per HPI  Physical Exam Triage Vital Signs ED Triage Vitals  Encounter Vitals Group     BP 02/05/24 1752 132/85     Girls Systolic BP Percentile --      Girls Diastolic BP Percentile --      Boys Systolic BP Percentile --      Boys Diastolic BP Percentile --      Pulse Rate 02/05/24 1752 65     Resp 02/05/24 1752 16     Temp 02/05/24 1752 98.1 F (36.7 C)     Temp Source 02/05/24 1752 Oral     SpO2  02/05/24 1752 97 %     Weight --      Height --      Head Circumference --      Peak Flow --      Pain Score 02/05/24 1751 7     Pain Loc --      Pain Education --      Exclude from Growth Chart --    No data found.  Updated Vital Signs BP 132/85 (BP Location: Right Arm)   Pulse 65   Temp 98.1 F (36.7 C) (Oral)   Resp 16   LMP 01/25/2024 (Approximate)   SpO2 97%   Visual Acuity Right Eye Distance:   Left Eye Distance:   Bilateral Distance:    Right Eye Near:   Left Eye Near:    Bilateral Near:     Physical Exam Vitals and nursing note reviewed.  Constitutional:      General: She is in acute distress (d/t pain).     Appearance: Normal appearance.  HENT:     Head: Normocephalic.  Eyes:     Extraocular Movements: Extraocular movements intact.     Pupils: Pupils are equal, round, and reactive to light.  Cardiovascular:     Rate and Rhythm: Normal rate and regular rhythm.     Pulses: Normal pulses.     Heart sounds: Normal heart sounds.  Pulmonary:     Effort: Pulmonary effort is normal.     Breath sounds: Normal breath sounds.  Abdominal:     General: Bowel sounds are normal.     Palpations: Abdomen is  soft.     Tenderness: There is no abdominal tenderness. There is left CVA tenderness. There is no right CVA tenderness.  Musculoskeletal:     Cervical back: Normal range of motion.       Back:  Skin:    General: Skin is warm and dry.  Neurological:     General: No focal deficit present.     Mental Status: She is alert and oriented to person, place, and time.  Psychiatric:        Mood and Affect: Mood normal.        Behavior: Behavior normal.      UC Treatments / Results  Labs (all labs ordered are listed, but only abnormal results are displayed) Labs Reviewed  POCT URINE DIPSTICK - Abnormal; Notable for the following components:      Result Value   Clarity, UA cloudy (*)    Spec Grav, UA >=1.030 (*)    All other components within normal limits    EKG   Radiology No results found.  Procedures Procedures (including critical care time)  Medications Ordered in UC Medications - No data to display  Initial Impression / Assessment and Plan / UC Course  I have reviewed the triage vital signs and the nursing notes.  Pertinent labs & imaging results that were available during my care of the patient were reviewed by me and considered in my medical decision making (see chart for details).  The urinalysis did not indicate an obvious urinary tract infection.  She does not have any blood in her urine to suggest a kidney stone.  Advised patient that given the limitations in this clinic, unable to perform lab work or imaging, recommended that she follow-up in the emergency department for further evaluation.  Patient was offered Toradol , but declined as she states she would like to go ahead and go to the emergency department.  On exam, patient with facial grimacing demonstrating discomfort due to her pain.  She does not have any abdominal tenderness however.  Given the patient's symptoms and negative urinalysis, patient was advised it is difficult to determine the cause of her symptoms.   Recommended further evaluation in the emergency department.  Patient was in agreement with this plan of care and verbalized understanding.  Patient discharged to the emergency department.  Patient's vital signs were stable at discharge.  Final Clinical Impressions(s) / UC Diagnoses   Final diagnoses:  Abdominal pain, unspecified abdominal location   Discharge Instructions   None    ED Prescriptions   None    PDMP not reviewed this encounter.   Gilmer Etta PARAS, NP 02/05/24 1939

## 2024-02-05 NOTE — ED Provider Notes (Signed)
 [facility] AT [facility] Provider Note  CSN: [id number] Arrival date & time: 02/05/24 1853  Chief Complaint(s) Flank Pain  HPI Allison Mckinney is a 33 y.o. female with no significant past medical history presenting to the emergency department with left flank pain.  Reports that has been present for a few days.  Reports has been worsening.  No urinary symptoms.  No fevers or chills.  Worse with movement.  Reports some nausea.  No fevers.  No history of similar pain.   Past Medical History Past Medical History:  Diagnosis Date   Acid reflux    Anxiety    Implanon  removal 09/28/2014   Recurrent cough    Scoliosis    Patient Active Problem List   Diagnosis Date Noted   Ear pain, right 12/30/2019   Depression with anxiety 10/20/2012   GERD (gastroesophageal reflux disease) 10/20/2012   Home Medication(s) Prior to Admission medications   Medication Sig Start Date End Date Taking? Authorizing Provider  etonogestrel  (NEXPLANON ) 68 MG IMPL implant 1 each by Subdermal route once.    [provider]                                                                                                                                    Past Surgical History No past surgical history on file. Family History Family History  Problem Relation Age of Onset   Anemia Mother    Cancer Paternal Grandfather        liver   Diabetes Maternal Grandfather    Cancer Maternal Grandfather        lung   Heart attack Father        open heart surgery    Social History Social History   Tobacco Use   Smoking status: Never   Smokeless tobacco: Never  Vaping Use   Vaping status: Never Used  Substance Use Topics   Alcohol use: Yes    Comment: rare social   Drug use: No   Allergies Other  Review of Systems Review of Systems  All other systems reviewed and are negative.   Physical Exam Vital Signs  I have reviewed the triage vital signs BP (!) 144/76    Pulse 68   Temp (!) 97.1 F (36.2 C)   Resp 16   Ht 5' 5 (1.651 m)   Wt 117.9 kg   LMP 01/25/2024 (Approximate)   SpO2 99%   BMI 43.27 kg/m  Physical Exam Vitals and nursing note reviewed.  Constitutional:      General: She is not in acute distress.    Appearance: She is well-developed.  HENT:     Head: Normocephalic and atraumatic.     Mouth/Throat:     Mouth: Mucous membranes are moist.  Eyes:     Pupils: Pupils are equal, round, and reactive to light.  Cardiovascular:     Rate and Rhythm:  Normal rate and regular rhythm.     Heart sounds: No murmur heard. Pulmonary:     Effort: Pulmonary effort is normal. No respiratory distress.     Breath sounds: Normal breath sounds.  Abdominal:     General: Abdomen is flat.     Palpations: Abdomen is soft.     Tenderness: There is no abdominal tenderness. There is left CVA tenderness. There is no right CVA tenderness.  Musculoskeletal:        General: No tenderness.     Right lower leg: No edema.     Left lower leg: No edema.  Skin:    General: Skin is warm and dry.  Neurological:     General: No focal deficit present.     Mental Status: She is alert. Mental status is at baseline.  Psychiatric:        Mood and Affect: Mood normal.        Behavior: Behavior normal.     ED Results and Treatments Labs (all labs ordered are listed, but only abnormal results are displayed) Labs Reviewed  URINALYSIS, ROUTINE W REFLEX MICROSCOPIC - Abnormal; Notable for the following components:      Result Value   APPearance HAZY (*)    Leukocytes,Ua TRACE (*)    Bacteria, UA RARE (*)    All other components within normal limits  CBC - Abnormal; Notable for the following components:   WBC 16.0 (*)    RBC 5.18 (*)    Platelets 427 (*)    All other components within normal limits  BASIC METABOLIC PANEL WITH GFR  POC URINE PREG, ED                                                                                                                           Radiology CT Renal Stone Study Result Date: 02/05/2024 EXAM: CT ABDOMEN AND PELVIS WITHOUT CONTRAST 02/05/2024 09:12:18 PM TECHNIQUE: CT of the abdomen and pelvis was performed without the administration of intravenous contrast. Multiplanar reformatted images are provided for review. Automated exposure control, iterative reconstruction, and/or weight-based adjustment of the mA/kV was utilized to reduce the radiation dose to as low as reasonably achievable. COMPARISON: Comparison with 01/24/2013. CLINICAL HISTORY: Abdominal/flank pain, stone suspected. FINDINGS: LOWER CHEST: No acute abnormality. LIVER: The liver is unremarkable. GALLBLADDER AND BILE DUCTS: Gallbladder is unremarkable. No biliary ductal dilatation. SPLEEN: No acute abnormality. PANCREAS: No acute abnormality. ADRENAL GLANDS: No acute abnormality. KIDNEYS, URETERS AND BLADDER: Mild left pelvicaliectasis, new from 2014. Abrupt tapering at the UPJ without stone ( series 6 image 97 ) . No stones in the kidneys or ureters. No perinephric or periureteral stranding. Urinary bladder is unremarkable. GI AND BOWEL: Stomach demonstrates no acute abnormality. There is no bowel obstruction. Normal appendix. PERITONEUM AND RETROPERITONEUM: No ascites. No free air. VASCULATURE: Aorta is normal in caliber. LYMPH NODES: No lymphadenopathy. REPRODUCTIVE ORGANS: No acute abnormality. BONES AND SOFT TISSUES: No acute osseous abnormality. No focal soft  tissue abnormality. IMPRESSION: 1. Mild left pelvicaliectasis, new from 2014, with abrupt tapering at the left ureteropelvic junction (UPJ) without visible stone. 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 Consider urologic evaluation and nuclear medicine lasix scan. Electronically signed by: Norman Gatlin MD 02/05/2024 09:18 PM EST RP Workstation: [id number]    Pertinent labs & imaging results that were available during my care of the patient were reviewed by me and considered in my medical  decision making (see MDM for details).  Medications Ordered in ED Medications  lactated ringers bolus 1,000 mL (1,000 mLs Intravenous New Bag/Given 02/05/24 2236)  ondansetron  (ZOFRAN ) injection 4 mg (4 mg Intravenous Given 02/05/24 2231)  ketorolac  (TORADOL ) 15 MG/ML injection 15 mg (15 mg Intravenous Given 02/05/24 2232)                                                                                                                                     Procedures Procedures  (including critical care time)  Medical Decision Making / ED Course   MDM:  33 year old presenting to the emergency department with left-sided flank pain.  Patient no acute distress, appears mildly uncomfortable.  Examination with mild CVA tenderness.  History seems concerning for possible kidney stone or kidney process such as pyelonephritis.  CT scan obtained which shows some dilation of the UPJ on the left with no obvious stone.  Differential includes radiopaque stone, congenital abnormality.  Discussed with urology Dr. Roseann recommends pain control, if pain is controlled can follow-up outpatient.  Considered also pyelonephritis however patient denies any fevers or chills, denies any urinary symptoms.  Labs do show leukocytosis, urinalysis not extremely concerning for UA somewhat contaminated by squamous cells but there are some rare bacteria noted and although concern for urinary infection is low will likely discharge on Keflex  given concern for possible urinary obstruction.  Will reassess.      Additional history obtained: -Additional history obtained from {wsadditionalhistorian:28072} -External records from outside source obtained and reviewed including: Chart review including previous notes, labs, imaging, consultation notes including ***   Lab Tests: -I ordered, reviewed, and interpreted labs.   The pertinent results include:   Labs Reviewed  URINALYSIS, ROUTINE W REFLEX MICROSCOPIC - Abnormal;  Notable for the following components:      Result Value   APPearance HAZY (*)    Leukocytes,Ua TRACE (*)    Bacteria, UA RARE (*)    All other components within normal limits  CBC - Abnormal; Notable for the following components:   WBC 16.0 (*)    RBC 5.18 (*)    Platelets 427 (*)    All other components within normal limits  BASIC METABOLIC PANEL WITH GFR  POC URINE PREG, ED    Notable for ***  EKG   EKG Interpretation Date/Time:    Ventricular Rate:    PR Interval:    QRS Duration:    QT Interval:  QTC Calculation:   R Axis:      Text Interpretation:           Imaging Studies ordered: I ordered imaging studies including *** On my interpretation imaging demonstrates *** I independently visualized and interpreted imaging. I agree with the radiologist interpretation   Medicines ordered and prescription drug management: Meds ordered this encounter  Medications   lactated ringers bolus 1,000 mL   ondansetron  (ZOFRAN ) injection 4 mg   ketorolac  (TORADOL ) 15 MG/ML injection 15 mg    -I have reviewed the patients home medicines and have made adjustments as needed   Consultations Obtained: I requested consultation with the ***,  and discussed lab and imaging findings as well as pertinent plan - they recommend: ***   Cardiac Monitoring: The patient was maintained on a cardiac monitor.  I personally viewed and interpreted the cardiac monitored which showed an underlying rhythm of: ***  Social Determinants of Health:  Diagnosis or treatment significantly limited by social determinants of health: {wssoc:28071}   Reevaluation: After the interventions noted above, I reevaluated the patient and found that their symptoms have {resolved/improved/worsened:23923::improved}  Co morbidities that complicate the patient evaluation  Past Medical History:  Diagnosis Date   Acid reflux    Anxiety    Implanon  removal 09/28/2014   Recurrent cough    Scoliosis        Dispostion: Disposition decision including need for hospitalization was considered, and patient {wsdispo:28070::discharged from emergency department.}    Final Clinical Impression(s) / ED Diagnoses Final diagnoses:  None     This chart was dictated using voice recognition software.  Despite best efforts to proofread,  errors can occur which can change the documentation meaning.

## 2024-02-07 LAB — URINE CULTURE

## 2024-02-12 ENCOUNTER — Ambulatory Visit: Payer: Self-pay | Admitting: Urology
